# Patient Record
Sex: Female | Born: 1960 | Race: White | Hispanic: No | Smoking: Former smoker
Health system: Southern US, Community
[De-identification: ages and names within clinical notes are randomized; demographics above are authoritative.]

## PROBLEM LIST (undated history)

## (undated) DIAGNOSIS — E785 Hyperlipidemia, unspecified: Secondary | ICD-10-CM

## (undated) DIAGNOSIS — G4730 Sleep apnea, unspecified: Secondary | ICD-10-CM

## (undated) DIAGNOSIS — I10 Essential (primary) hypertension: Secondary | ICD-10-CM

## (undated) HISTORY — DX: Essential (primary) hypertension: I10

## (undated) HISTORY — PX: EYE SURGERY: SHX253

## (undated) HISTORY — DX: Hyperlipidemia, unspecified: E78.5

## (undated) HISTORY — DX: Sleep apnea, unspecified: G47.30

---

## 2000-06-11 ENCOUNTER — Other Ambulatory Visit: Admission: RE | Admit: 2000-06-11 | Discharge: 2000-06-11 | Payer: Self-pay | Admitting: Internal Medicine

## 2001-09-01 ENCOUNTER — Encounter: Payer: Self-pay | Admitting: Occupational Medicine

## 2001-09-01 ENCOUNTER — Encounter: Admission: RE | Admit: 2001-09-01 | Discharge: 2001-09-01 | Payer: Self-pay | Admitting: Occupational Medicine

## 2002-09-01 ENCOUNTER — Encounter: Payer: Self-pay | Admitting: Family Medicine

## 2002-09-01 ENCOUNTER — Encounter: Admission: RE | Admit: 2002-09-01 | Discharge: 2002-09-01 | Payer: Self-pay | Admitting: Family Medicine

## 2004-01-02 ENCOUNTER — Encounter: Admission: RE | Admit: 2004-01-02 | Discharge: 2004-01-02 | Payer: Self-pay | Admitting: Occupational Medicine

## 2004-01-08 ENCOUNTER — Encounter: Admission: RE | Admit: 2004-01-08 | Discharge: 2004-01-08 | Payer: Self-pay | Admitting: Occupational Medicine

## 2005-03-21 ENCOUNTER — Encounter: Admission: RE | Admit: 2005-03-21 | Discharge: 2005-03-21 | Payer: Self-pay | Admitting: Neurosurgery

## 2006-05-14 ENCOUNTER — Ambulatory Visit: Payer: Self-pay | Admitting: Family Medicine

## 2007-10-15 ENCOUNTER — Ambulatory Visit: Payer: Self-pay | Admitting: Internal Medicine

## 2007-11-18 ENCOUNTER — Ambulatory Visit: Payer: Self-pay | Admitting: Internal Medicine

## 2007-11-26 ENCOUNTER — Ambulatory Visit: Payer: Self-pay | Admitting: Internal Medicine

## 2009-08-29 ENCOUNTER — Ambulatory Visit: Payer: Self-pay | Admitting: Family Medicine

## 2010-11-30 ENCOUNTER — Encounter: Payer: Self-pay | Admitting: Occupational Medicine

## 2010-12-19 ENCOUNTER — Ambulatory Visit: Payer: Self-pay | Admitting: Internal Medicine

## 2011-03-25 ENCOUNTER — Ambulatory Visit: Payer: Self-pay | Admitting: Gastroenterology

## 2011-03-27 LAB — PATHOLOGY REPORT

## 2011-12-12 ENCOUNTER — Ambulatory Visit: Payer: Self-pay

## 2011-12-22 ENCOUNTER — Ambulatory Visit: Payer: Self-pay

## 2013-02-09 ENCOUNTER — Ambulatory Visit: Payer: Self-pay

## 2013-08-30 IMAGING — MG MAM DGTL SCREENING MAMMO W/CAD
1 series · 4 of 4 positions shown · non-contrast
Comparison: none

REASON FOR EXAM: SCR MAMMO
COMMENTS:

[R CC · right · 4 of 4 slices shown]
[im 1/4]
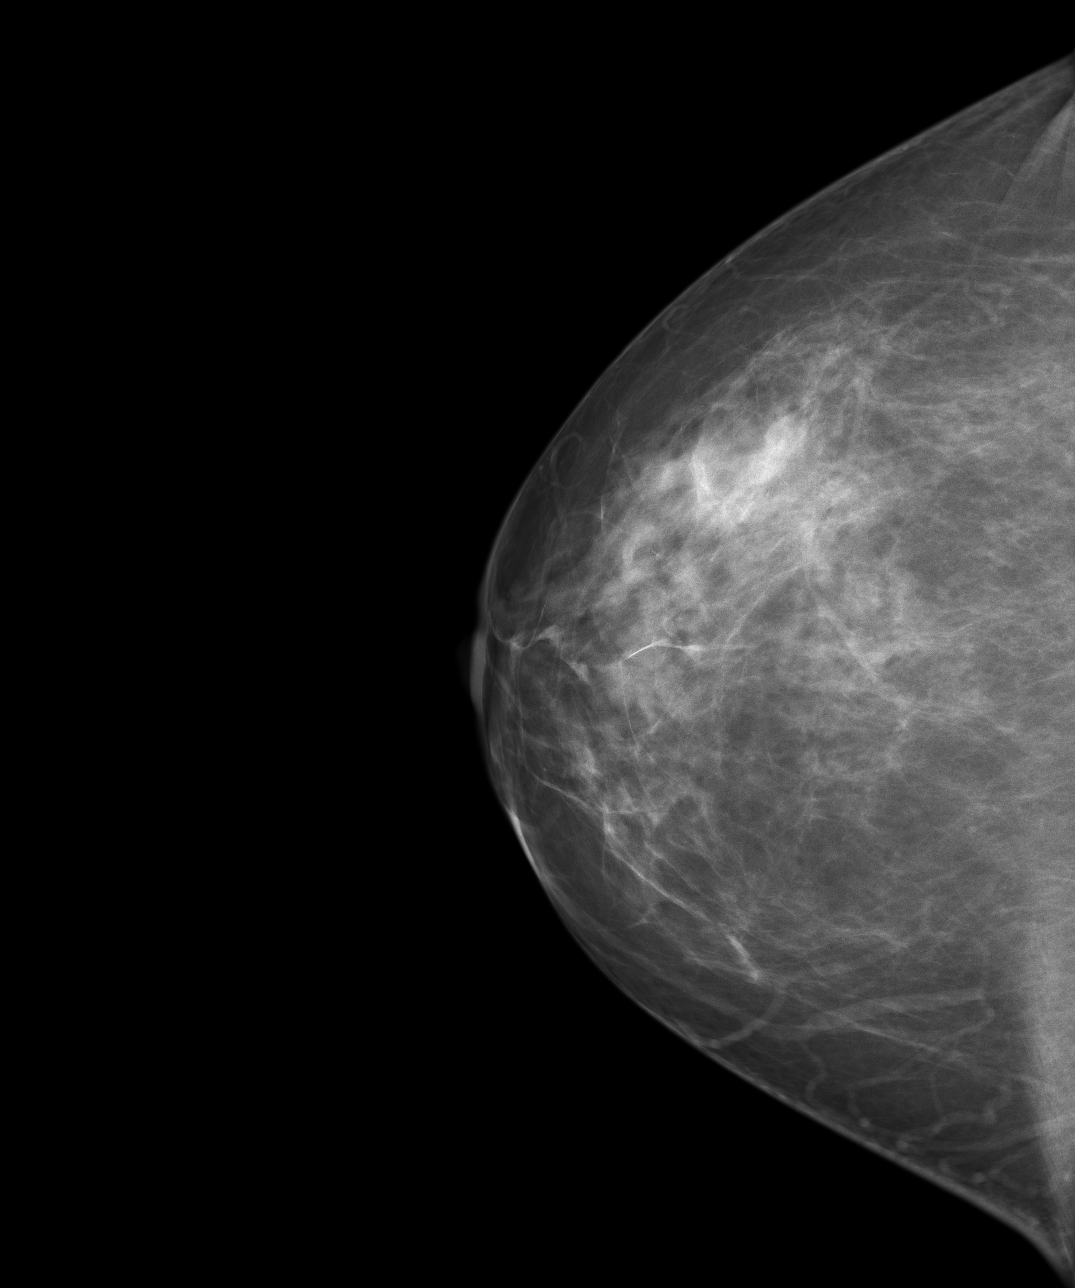
[im 2/4]
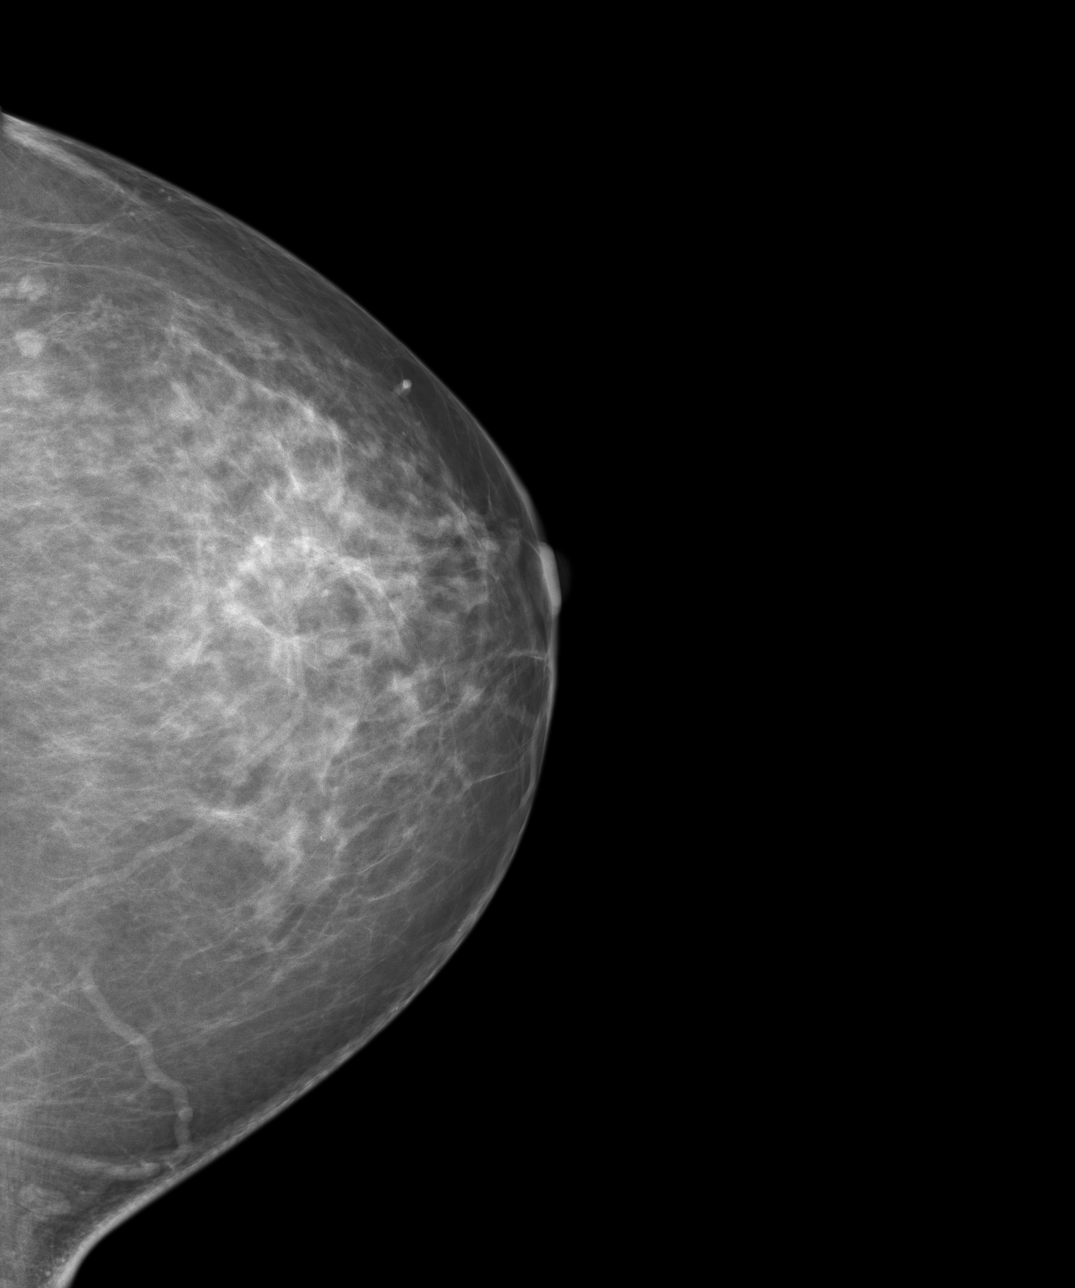
[im 3/4]
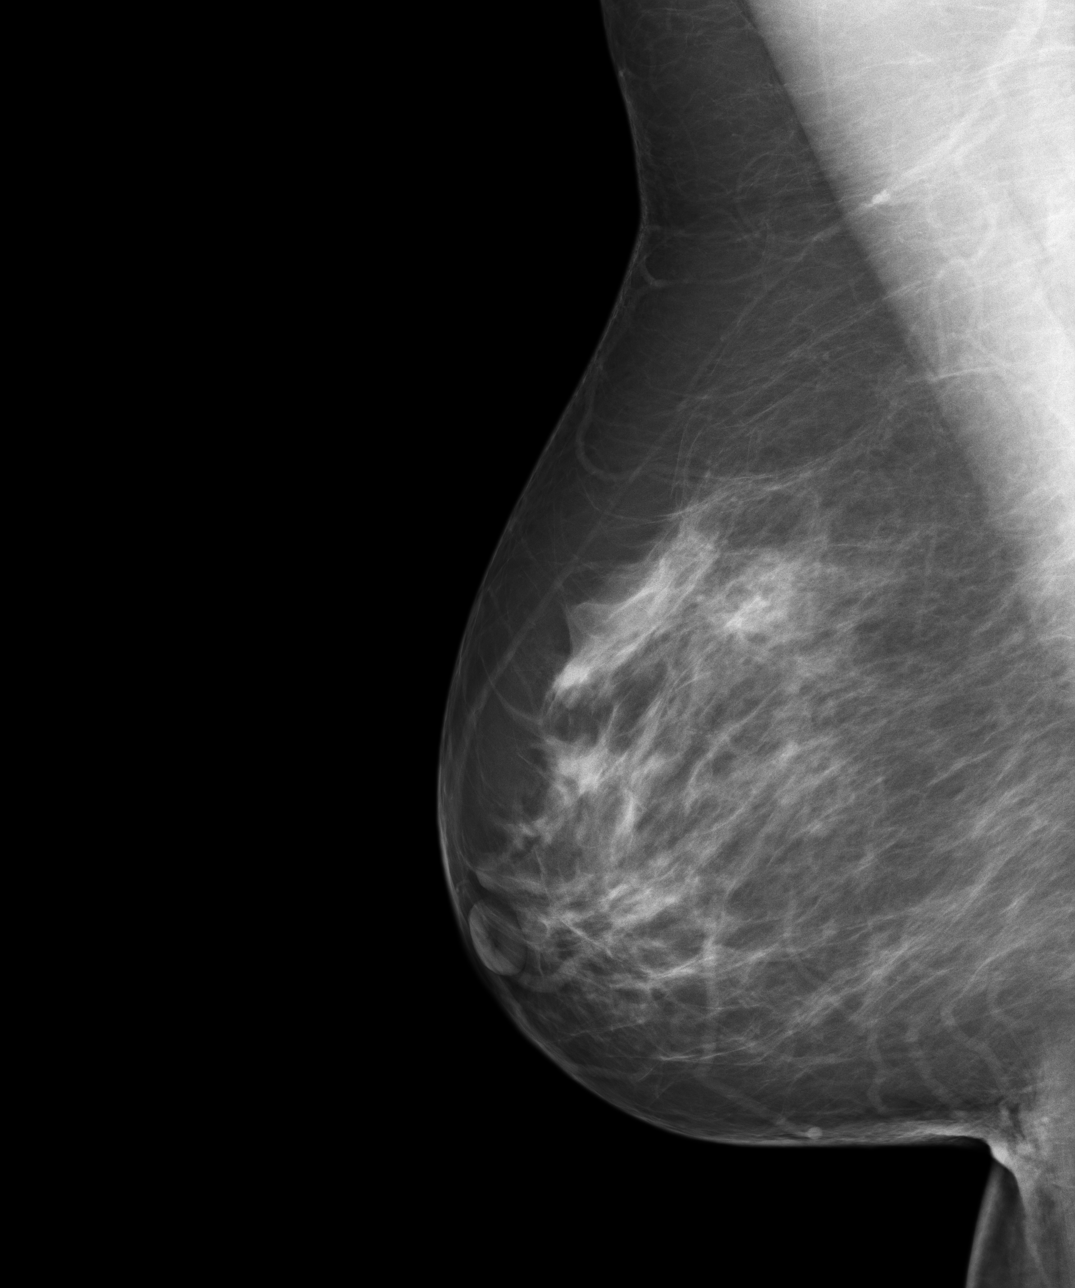
[im 4/4]
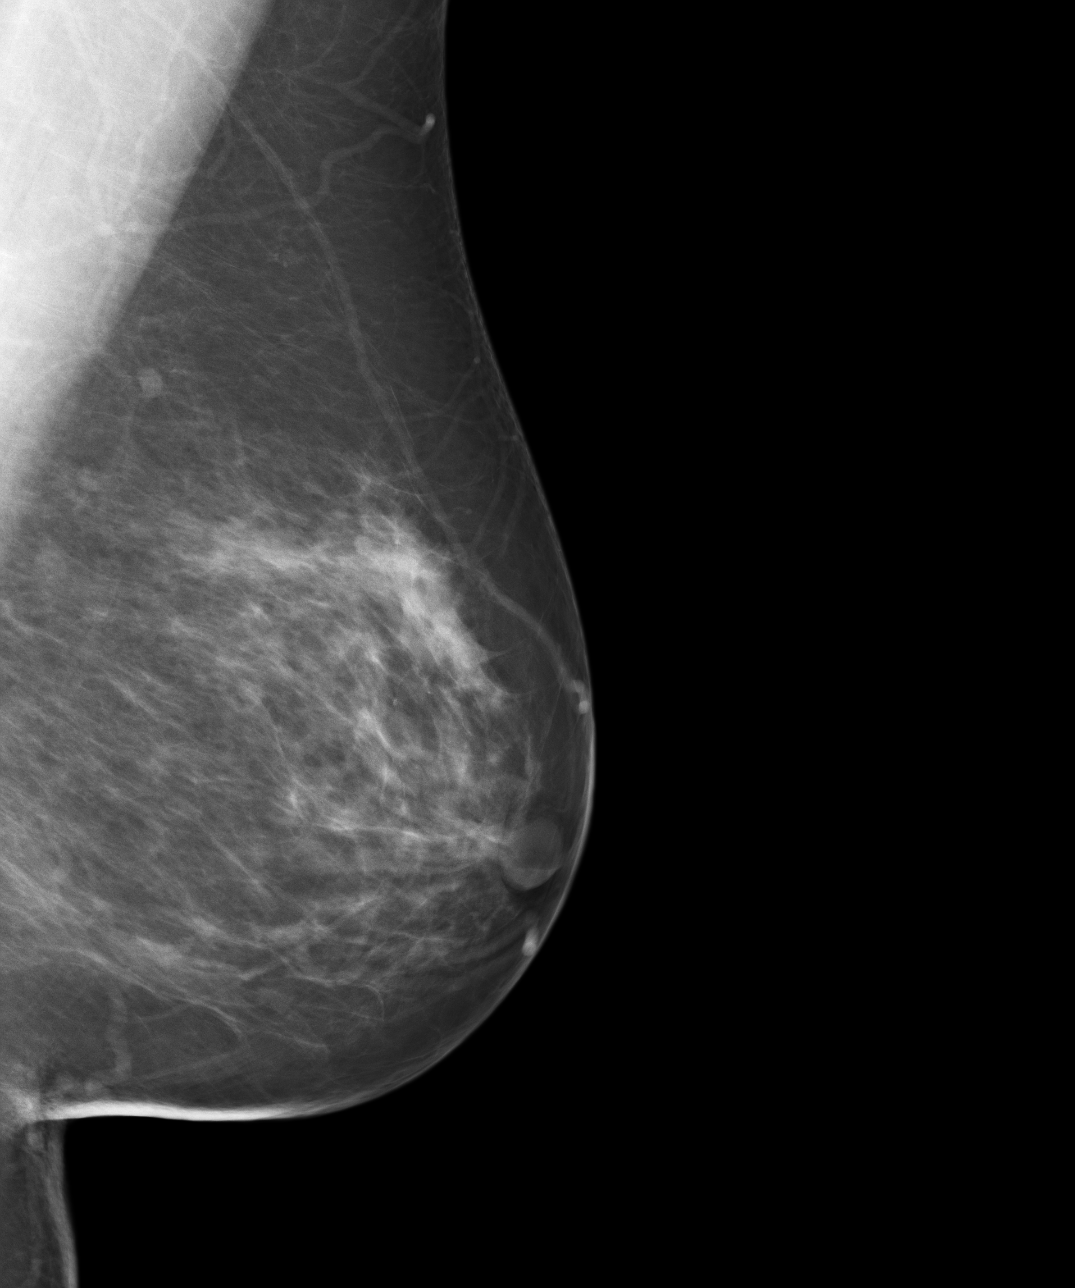

[4 of 4 positions shown; findings below may reference images not displayed]

PROCEDURE:     MAM - MAM DGTL SCREENING MAMMO W/CAD  - December 22, 2011 [DATE]

RESULT:      Comparison is made to a previous digital study of 12/19/2010 and
11/18/2007.

The breasts exhibit a heterogeously dense parenchymal pattern. Stable
nodularity is noted superolaterally on the left. There is no dominant mass
and there are no malignant-appearing groupings of microcalcification. No
area of new architectural distortion is seen.
IMPRESSION: 1.      I do not see findings suspicious for malignancy.
2.      BI-RADS 2:  Benign Findings.

RECOMMENTATION:   Please continue to encourage yearly mammographic follow
up.

A NEGATIVE MAMMOGRAM REPORT DOES NOT PRECLUDE BIOPSY OR OTHER EVALUATION OF
A CLINICALLY PALPABLE OR OTHERWISE SUSPICOUS MASS OR LESION.  BREAST CANCER
MAY NOT BE DETECTED BY MAMMOGRAPHY IN UP TO 10% OF CASES.

## 2014-03-30 ENCOUNTER — Ambulatory Visit: Payer: Self-pay

## 2014-04-05 ENCOUNTER — Ambulatory Visit: Payer: Self-pay

## 2014-10-19 IMAGING — MG MAM DGTL SCRN MAM NO ORDER W/CAD
1 series · 5 of 5 positions shown · non-contrast
Comparison: none

REASON FOR EXAM: SCR MAMMO NO ORDER
COMMENTS:

PROCEDURE:     MAM - MAM DGTL SCRN MAM NO ORDER W/CAD  - February 09, 2013  [DATE]
RESULT:     Bilateral digital screening mammogram with CAD dated 02/09/2013
Comparison study/studies dated: 12/22/2011, 12/19/2010, 11/18/2007

[R CC · right · 5 of 5 slices shown]
[im 1/5]
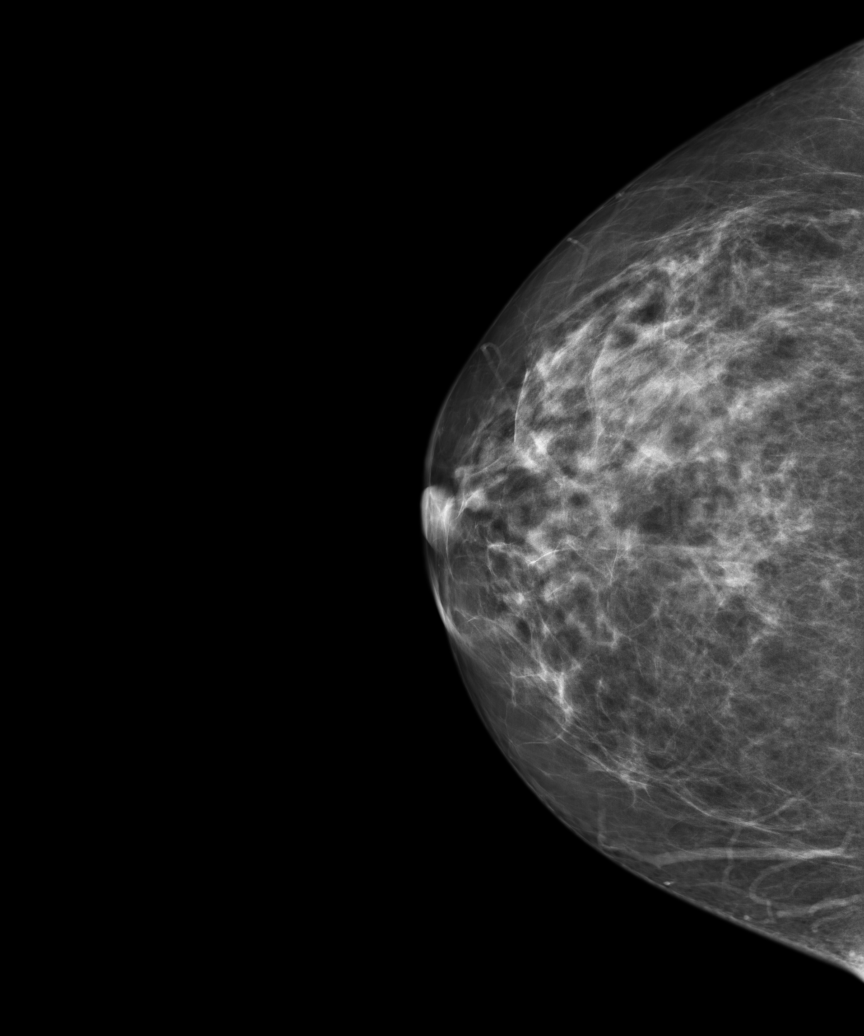
[im 2/5]
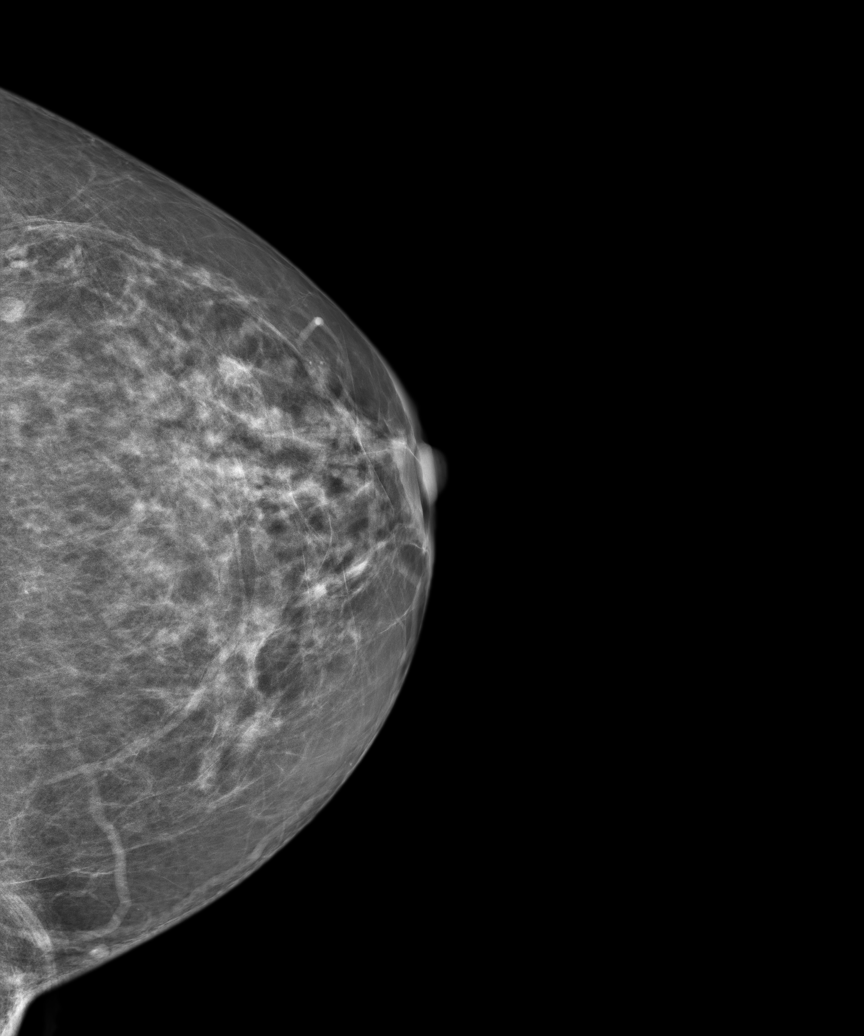
[im 3/5]
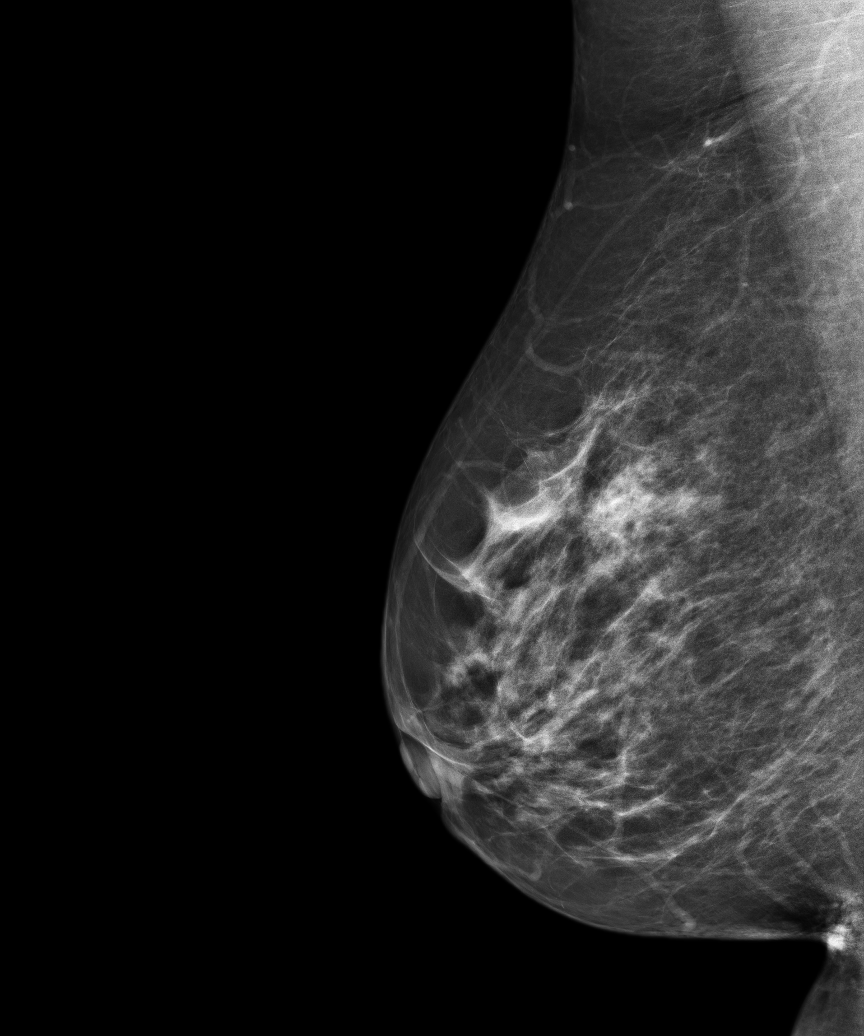
[im 4/5]
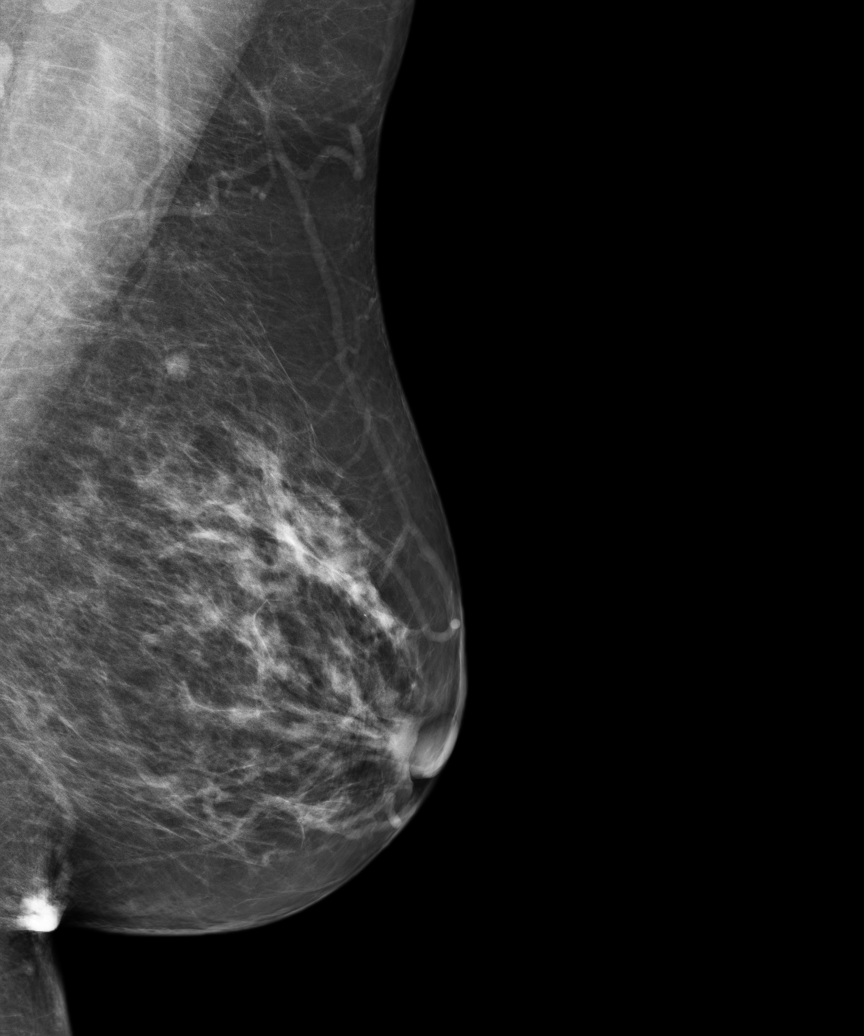
[im 5/5]
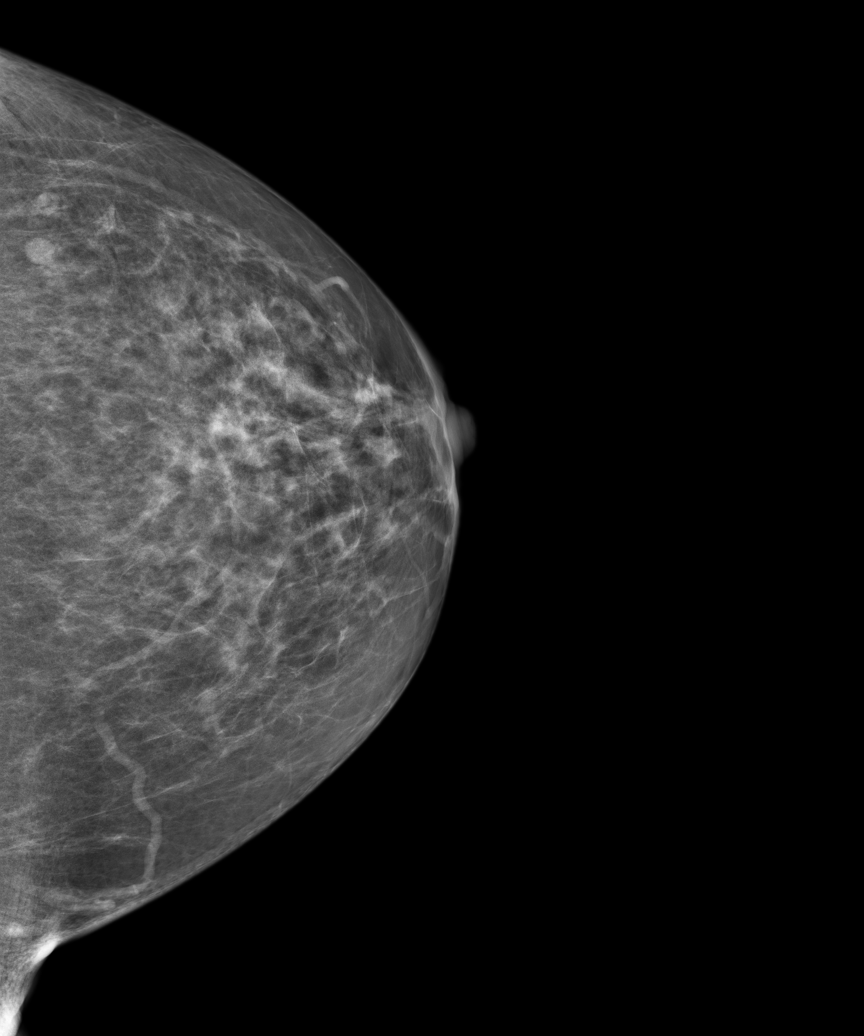

[5 of 5 positions shown; findings below may reference images not displayed]

FINDINGS: BREAST COMPOSITION: The breast composition is HETEROGENEOUSLY
DENSE (glandular tissue is 51-75%) This may decrease the sensitivity of
mammography.

There is no new radiographic evidence to suggest malignancy.
IMPRESSION: BI-RADS: Category 2- Benign Finding

A NEGATIVE MAMMOGRAM REPORT DOES NOT PRECLUDE BIOPSY OR OTHER EVALUATION OF
A CLINICALLY PALPABLE OR OTHERWISE SUSPICIOUS MASS OR LESION. BREAST CANCER
MAY NOT BE DETECTED IN UP TO 10% OF CASES.

## 2015-12-13 IMAGING — MG MM ADDITIONAL VIEWS AT NO CHARGE
1 series · 2 of 2 positions shown · non-contrast
Comparison: 03/30/2014 and prior mammograms

CLINICAL DATA: 53-year-old female with possible left breast mass on
screening mammogram.

EXAM:
DIGITAL DIAGNOSTIC  LEFT MAMMOGRAM

[L CC · left · 2 of 2 slices shown]
[im 1/2]
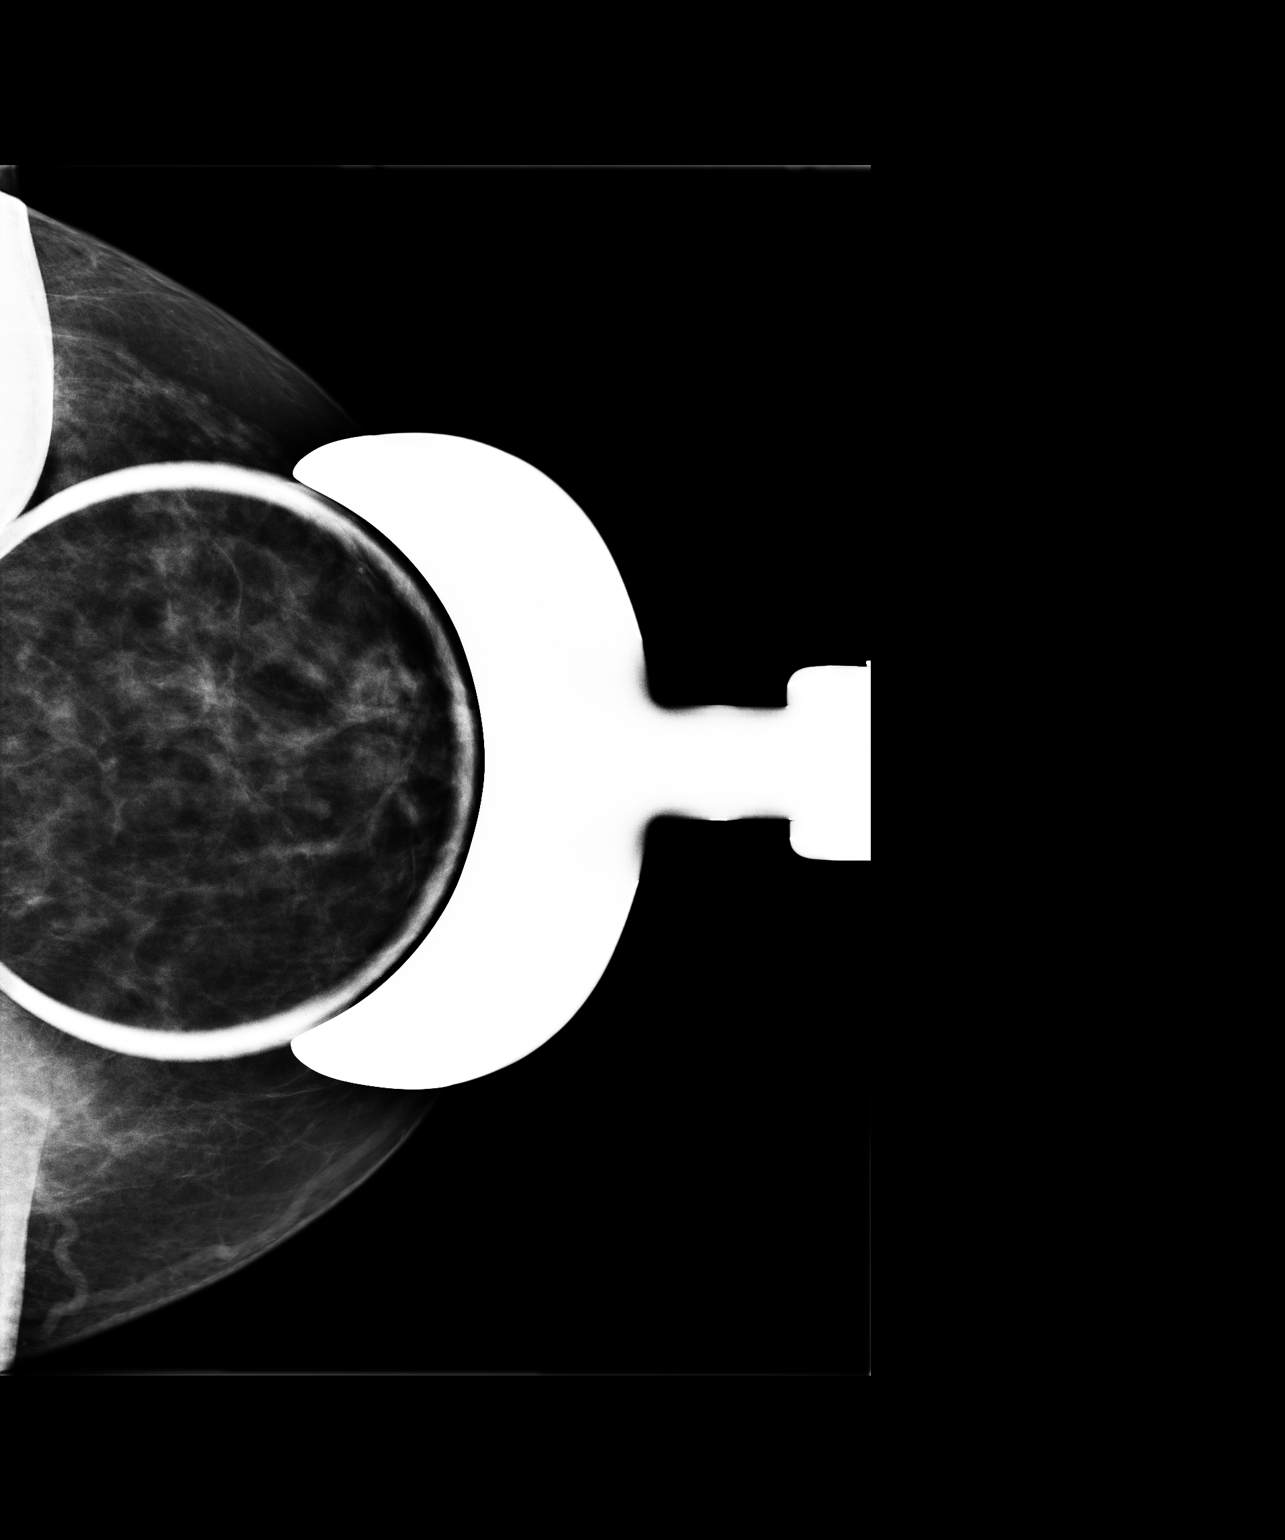
[im 2/2]
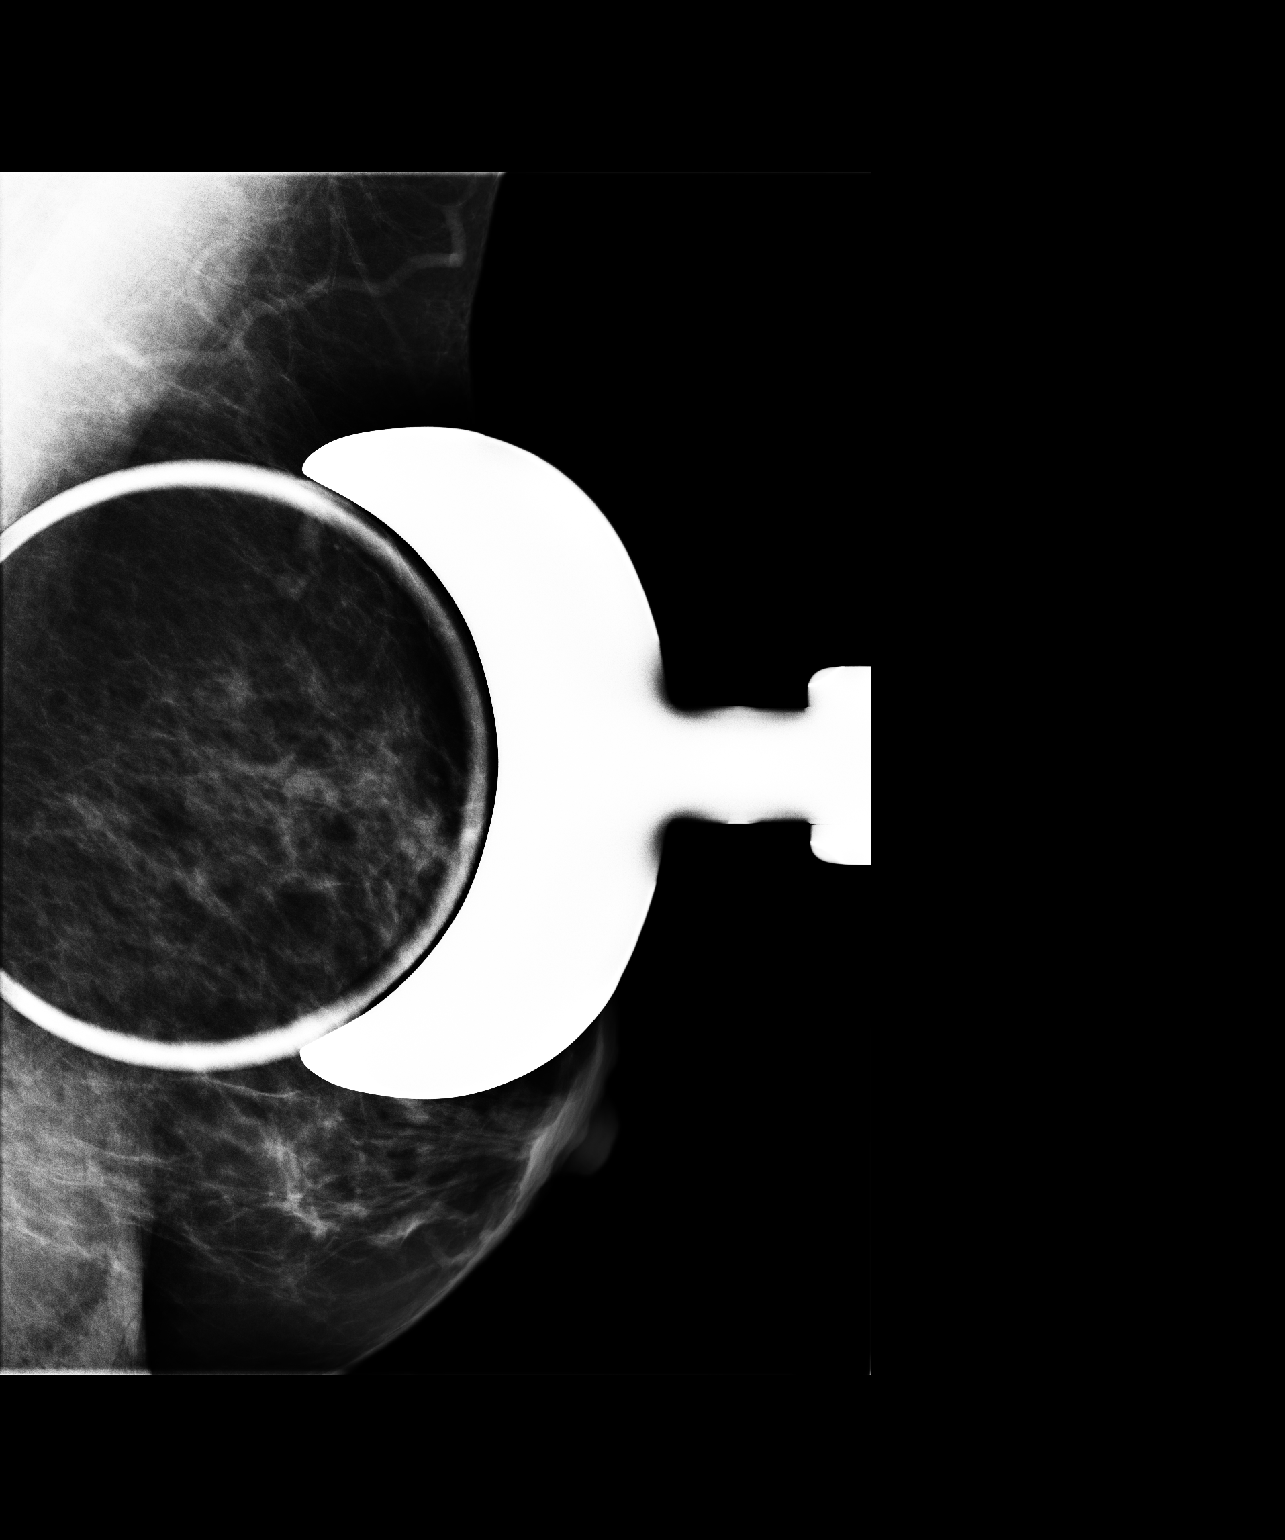

[2 of 2 positions shown; findings below may reference images not displayed]

ACR Breast Density Category b: There are scattered areas of
fibroglandular density.
FINDINGS: CC and MLO spot compression views the left breast demonstrate no
persistent mass, distortion or worrisome calcifications in the area
of the screening study finding.
IMPRESSION: No persistent abnormality in the area of the screening study
finding.

RECOMMENDATION:
Bilateral screening mammograms in 1 year.

I have discussed the findings and recommendations with the patient.
Results were also provided in writing at the conclusion of the
visit. If applicable, a reminder letter will be sent to the patient
regarding the next appointment.

BI-RADS CATEGORY  1: Negative.

## 2016-05-19 DIAGNOSIS — F331 Major depressive disorder, recurrent, moderate: Secondary | ICD-10-CM | POA: Diagnosis not present

## 2016-05-19 DIAGNOSIS — G4733 Obstructive sleep apnea (adult) (pediatric): Secondary | ICD-10-CM | POA: Diagnosis not present

## 2016-05-19 DIAGNOSIS — J449 Chronic obstructive pulmonary disease, unspecified: Secondary | ICD-10-CM | POA: Diagnosis not present

## 2016-05-19 DIAGNOSIS — R0602 Shortness of breath: Secondary | ICD-10-CM | POA: Diagnosis not present

## 2016-07-07 DIAGNOSIS — R011 Cardiac murmur, unspecified: Secondary | ICD-10-CM | POA: Diagnosis not present

## 2016-07-07 DIAGNOSIS — F17211 Nicotine dependence, cigarettes, in remission: Secondary | ICD-10-CM | POA: Diagnosis not present

## 2016-07-07 DIAGNOSIS — I10 Essential (primary) hypertension: Secondary | ICD-10-CM | POA: Diagnosis not present

## 2016-07-07 DIAGNOSIS — Z0001 Encounter for general adult medical examination with abnormal findings: Secondary | ICD-10-CM | POA: Diagnosis not present

## 2016-07-07 DIAGNOSIS — R609 Edema, unspecified: Secondary | ICD-10-CM | POA: Diagnosis not present

## 2016-07-23 DIAGNOSIS — R0602 Shortness of breath: Secondary | ICD-10-CM | POA: Diagnosis not present

## 2016-08-29 ENCOUNTER — Other Ambulatory Visit
Admission: RE | Admit: 2016-08-29 | Discharge: 2016-08-29 | Disposition: A | Discharge status: Home / Self Care | Payer: Federal, State, Local not specified - PPO | Source: Ambulatory Visit | Attending: Nurse Practitioner | Admitting: Nurse Practitioner

## 2016-08-29 DIAGNOSIS — R601 Generalized edema: Secondary | ICD-10-CM | POA: Insufficient documentation

## 2016-08-29 DIAGNOSIS — E559 Vitamin D deficiency, unspecified: Secondary | ICD-10-CM | POA: Insufficient documentation

## 2016-08-29 DIAGNOSIS — E785 Hyperlipidemia, unspecified: Secondary | ICD-10-CM | POA: Diagnosis not present

## 2016-08-29 DIAGNOSIS — Z0000 Encounter for general adult medical examination without abnormal findings: Secondary | ICD-10-CM | POA: Insufficient documentation

## 2016-08-29 DIAGNOSIS — I10 Essential (primary) hypertension: Secondary | ICD-10-CM | POA: Diagnosis not present

## 2016-08-29 LAB — CBC
HCT: 42.6 % (ref 35.0–47.0)
Hemoglobin: 14.6 g/dL (ref 12.0–16.0)
MCH: 30.8 pg (ref 26.0–34.0)
MCHC: 34.3 g/dL (ref 32.0–36.0)
MCV: 89.8 fL (ref 80.0–100.0)
PLATELETS: 143 10*3/uL — AB (ref 150–440)
RBC: 4.74 MIL/uL (ref 3.80–5.20)
RDW: 13.2 % (ref 11.5–14.5)
WBC: 6.7 10*3/uL (ref 3.6–11.0)

## 2016-08-29 LAB — LIPID PANEL
CHOL/HDL RATIO: 2.4 ratio
CHOLESTEROL: 116 mg/dL (ref 0–200)
HDL: 49 mg/dL (ref >40)
LDL Cholesterol: 45 mg/dL (ref 0–99)
Triglycerides: 111 mg/dL (ref <150)
VLDL: 22 mg/dL (ref 0–40)

## 2016-08-29 LAB — COMPREHENSIVE METABOLIC PANEL
ALBUMIN: 4.1 g/dL (ref 3.5–5.0)
ALT: 19 U/L (ref 14–54)
ANION GAP: 9 (ref 5–15)
AST: 25 U/L (ref 15–41)
Alkaline Phosphatase: 48 U/L (ref 38–126)
BUN: 19 mg/dL (ref 6–20)
CHLORIDE: 101 mmol/L (ref 101–111)
CO2: 27 mmol/L (ref 22–32)
Calcium: 9.5 mg/dL (ref 8.9–10.3)
Creatinine, Ser: 0.97 mg/dL (ref 0.44–1.00)
GFR calc Af Amer: >60 mL/min (ref >60)
Glucose, Bld: 112 mg/dL — ABNORMAL HIGH (ref 65–99)
POTASSIUM: 3.9 mmol/L (ref 3.5–5.1)
Sodium: 137 mmol/L (ref 135–145)
Total Bilirubin: 0.7 mg/dL (ref 0.3–1.2)
Total Protein: 7.2 g/dL (ref 6.5–8.1)

## 2016-08-29 LAB — T4, FREE: FREE T4: 0.96 ng/dL (ref 0.61–1.12)

## 2016-08-29 LAB — TSH: TSH: 2.611 u[IU]/mL (ref 0.350–4.500)

## 2016-08-30 LAB — VITAMIN D 25 HYDROXY (VIT D DEFICIENCY, FRACTURES): Vit D, 25-Hydroxy: 26.7 ng/mL — ABNORMAL LOW (ref 30.0–100.0)

## 2016-09-03 DIAGNOSIS — F17211 Nicotine dependence, cigarettes, in remission: Secondary | ICD-10-CM | POA: Diagnosis not present

## 2016-09-03 DIAGNOSIS — G4733 Obstructive sleep apnea (adult) (pediatric): Secondary | ICD-10-CM | POA: Diagnosis not present

## 2016-09-03 DIAGNOSIS — R011 Cardiac murmur, unspecified: Secondary | ICD-10-CM | POA: Diagnosis not present

## 2016-09-03 DIAGNOSIS — I10 Essential (primary) hypertension: Secondary | ICD-10-CM | POA: Diagnosis not present

## 2016-09-15 DIAGNOSIS — J449 Chronic obstructive pulmonary disease, unspecified: Secondary | ICD-10-CM | POA: Diagnosis not present

## 2016-09-15 DIAGNOSIS — G4733 Obstructive sleep apnea (adult) (pediatric): Secondary | ICD-10-CM | POA: Diagnosis not present

## 2016-09-15 DIAGNOSIS — F17211 Nicotine dependence, cigarettes, in remission: Secondary | ICD-10-CM | POA: Diagnosis not present

## 2017-04-27 DIAGNOSIS — E782 Mixed hyperlipidemia: Secondary | ICD-10-CM | POA: Diagnosis not present

## 2017-04-27 DIAGNOSIS — G4733 Obstructive sleep apnea (adult) (pediatric): Secondary | ICD-10-CM | POA: Diagnosis not present

## 2017-04-27 DIAGNOSIS — J449 Chronic obstructive pulmonary disease, unspecified: Secondary | ICD-10-CM | POA: Diagnosis not present

## 2017-04-27 DIAGNOSIS — I10 Essential (primary) hypertension: Secondary | ICD-10-CM | POA: Diagnosis not present

## 2017-07-20 DIAGNOSIS — R609 Edema, unspecified: Secondary | ICD-10-CM | POA: Diagnosis not present

## 2017-07-20 DIAGNOSIS — J449 Chronic obstructive pulmonary disease, unspecified: Secondary | ICD-10-CM | POA: Diagnosis not present

## 2017-07-20 DIAGNOSIS — E6609 Other obesity due to excess calories: Secondary | ICD-10-CM | POA: Diagnosis not present

## 2017-07-20 DIAGNOSIS — G4733 Obstructive sleep apnea (adult) (pediatric): Secondary | ICD-10-CM | POA: Diagnosis not present

## 2017-10-26 ENCOUNTER — Other Ambulatory Visit: Payer: Self-pay | Admitting: Nurse Practitioner

## 2017-10-27 ENCOUNTER — Other Ambulatory Visit: Payer: Self-pay | Admitting: Internal Medicine

## 2017-11-02 ENCOUNTER — Other Ambulatory Visit: Payer: Self-pay | Admitting: Internal Medicine

## 2018-01-12 ENCOUNTER — Other Ambulatory Visit: Payer: Self-pay

## 2018-01-12 MED ORDER — FUROSEMIDE 20 MG PO TABS: 20.0000 mg | ORAL_TABLET | Freq: Every day | ORAL | 5 refills | Status: DC | PRN

## 2018-01-12 MED ORDER — METOPROLOL SUCCINATE 100 MG PO CS24: 100.0000 mg | EXTENDED_RELEASE_CAPSULE | Freq: Every day | ORAL | 5 refills | Status: DC

## 2018-01-12 MED ORDER — LOSARTAN POTASSIUM-HCTZ 100-25 MG PO TABS: 1.0000 | ORAL_TABLET | Freq: Every day | ORAL | 5 refills | Status: DC

## 2018-03-18 ENCOUNTER — Ambulatory Visit: Payer: Federal, State, Local not specified - PPO | Admitting: Internal Medicine

## 2018-03-18 ENCOUNTER — Encounter: Payer: Self-pay | Admitting: Internal Medicine

## 2018-03-18 VITALS — BP 148/80 | HR 64 | Resp 16 | Ht 69.0 in | Wt 231.0 lb

## 2018-03-18 DIAGNOSIS — Z9989 Dependence on other enabling machines and devices: Secondary | ICD-10-CM | POA: Diagnosis not present

## 2018-03-18 DIAGNOSIS — I10 Essential (primary) hypertension: Secondary | ICD-10-CM | POA: Insufficient documentation

## 2018-03-18 DIAGNOSIS — J449 Chronic obstructive pulmonary disease, unspecified: Secondary | ICD-10-CM | POA: Diagnosis not present

## 2018-03-18 DIAGNOSIS — G4733 Obstructive sleep apnea (adult) (pediatric): Secondary | ICD-10-CM | POA: Diagnosis not present

## 2018-03-18 DIAGNOSIS — E785 Hyperlipidemia, unspecified: Secondary | ICD-10-CM | POA: Insufficient documentation

## 2018-03-18 DIAGNOSIS — G4730 Sleep apnea, unspecified: Secondary | ICD-10-CM | POA: Insufficient documentation

## 2018-03-18 NOTE — Progress Notes (Signed)
[facility] [place], [place] [address]  Pulmonary Sleep Medicine   Office Visit Note  Patient Name: Breanna Flores DOB: Nov 15, 1960 MRN [id number]  Date of Service: 03/18/2018  Complaints/HPI:   She is doing well.  She has had no admissions to the hospital.  Continues to use her CPAP device as prescribed.  She is working on trying to lose weight.  She is not smoking.  Denies having any chest pain no cough no congestion.  No nausea no vomiting.  ROS  General: (-) fever, (-) chills, (-) night sweats, (-) weakness Skin: (-) rashes, (-) itching,. Eyes: (-) visual changes, (-) redness, (-) itching. Nose and Sinuses: (-) nasal stuffiness or itchiness, (-) postnasal drip, (-) nosebleeds, (-) sinus trouble. Mouth and Throat: (-) sore throat, (-) hoarseness. Neck: (-) swollen glands, (-) enlarged thyroid, (-) neck pain. Respiratory: - cough, (-) bloody sputum, - shortness of breath, - wheezing. Cardiovascular: - ankle swelling, (-) chest pain. Lymphatic: (-) lymph node enlargement. Neurologic: (-) numbness, (-) tingling. Psychiatric: (-) anxiety, (-) depression   Current Medication: Outpatient Encounter Medications as of 03/18/2018  Medication Sig  . amLODipine (NORVASC) 10 MG tablet TAKE 1 TABLET BY MOUTH ONCE DAILY  . CHANTIX CONTINUING MONTH PAK 1 MG tablet TAKE AS DIRECTED  . furosemide (LASIX) 20 MG tablet Take 1 tablet (20 mg total) by mouth daily as needed.  Marland Kitchen losartan-hydrochlorothiazide (HYZAAR) 100-25 MG tablet Take 1 tablet by mouth daily.  . Metoprolol Succinate 100 MG CS24 Take 100 mg by mouth daily.  . simvastatin (ZOCOR) 20 MG tablet TAKE 1 TABLET BY MOUTH ONCE DAILY   No facility-administered encounter medications on file as of 03/18/2018.     Surgical History: Past Surgical History:  Procedure Laterality Date  . [place] History: Past Medical History:  Diagnosis Date  . Hyperlipidemia   . Hypertension   . Sleep  apnea     Family History: Family History  Problem Relation Age of Onset  . Dementia Mother   . Heart murmur Mother   . Hypertension Father   . Gout Father   . Hyperlipidemia Father     Social History: Social History   Socioeconomic History  . Marital status: Married    Spouse name: Not on file  . Number of children: Not on file  . Years of education: Not on file  . Highest education level: Not on file  Occupational History  . Not on file  Social Needs  . Financial resource strain: Not on file  . Food insecurity:    Worry: Not on file    Inability: Not on file  . Transportation needs:    Medical: Not on file    Non-medical: Not on file  Tobacco Use  . Smoking status: Former Smoker    Last attempt to quit: 2016    Years since quitting: 3.3  . Smokeless tobacco: Never Used  Substance and Sexual Activity  . Alcohol use: Yes    Comment: occationally  . Drug use: Never  . Sexual activity: Not on file  Lifestyle  . Physical activity:    Days per week: Not on file    Minutes per session: Not on file  . Stress: Not on file  Relationships  . Social connections:    Talks on phone: Not on file    Gets together: Not on file    Attends religious service: Not on file    Active  member of club or organization: Not on file    Attends meetings of clubs or organizations: Not on file    Relationship status: Not on file  . Intimate partner violence:    Fear of current or ex partner: Not on file    Emotionally abused: Not on file    Physically abused: Not on file    Forced sexual activity: Not on file  Other Topics Concern  . Not on file  Social History Narrative  . Not on file    Vital Signs: Blood pressure (!) 148/80, pulse 64, resp. rate 16, height 5\' 9"  (1.753 m), weight 231 lb (104.8 kg), SpO2 99 %.  Examination: General Appearance: The patient is well-developed, well-nourished, and in no distress. Skin: Gross inspection of skin unremarkable. Head: normocephalic,  no gross deformities. Eyes: no gross deformities noted. ENT: ears appear grossly normal no exudates. Neck: Supple. No thyromegaly. No LAD. Respiratory: no rhonchi. Cardiovascular: Normal S1 and S2 without murmur or rub. Extremities: No cyanosis. pulses are equal. Neurologic: Alert and oriented. No involuntary movements.  LABS: No results found for this or any previous visit (from the past 2160 hour(s)).  Radiology: No results found.  No results found.  No results found.    Assessment and Plan: Patient Active Problem List   Diagnosis Date Noted  . Sleep apnea 03/18/2018  . Essential hypertension 03/18/2018  . Hyperlipidemia 03/18/2018    1. OSA  Continue with CPAP on the present settings continue with supportive care 2. COPD stable at this time since she quit smoking continue to monitor 3. Morbid obesity working on weight loss with dietary restriction  General Counseling: I have discussed the findings of the evaluation and examination with [name].  I have also discussed any further diagnostic evaluation thatmay be needed or ordered today. [name] verbalizes understanding of the findings of todays visit. We also reviewed her medications today and discussed drug interactions and side effects including but not limited excessive drowsiness and altered mental states. We also discussed that there is always a risk not just to her but also people around her. she has been encouraged to call the office with any questions or concerns that should arise related to todays visit.    Time spent: 52min  I have personally obtained a history, examined the patient, evaluated laboratory and imaging results, formulated the assessment and plan and placed orders.    Allyne Gee, MD [facility] Pulmonary and Critical Care Sleep medicine

## 2018-03-18 NOTE — Patient Instructions (Signed)

## 2018-03-23 ENCOUNTER — Other Ambulatory Visit: Payer: Self-pay | Admitting: Internal Medicine

## 2018-05-26 ENCOUNTER — Other Ambulatory Visit: Payer: Self-pay | Admitting: Internal Medicine

## 2018-05-27 ENCOUNTER — Other Ambulatory Visit: Payer: Self-pay

## 2018-05-27 MED ORDER — SIMVASTATIN 20 MG PO TABS: 20.0000 mg | ORAL_TABLET | Freq: Every day | ORAL | 5 refills | Status: DC

## 2018-06-28 ENCOUNTER — Other Ambulatory Visit: Payer: Self-pay

## 2018-06-28 MED ORDER — METOPROLOL SUCCINATE 100 MG PO CS24: 100.0000 mg | EXTENDED_RELEASE_CAPSULE | Freq: Every day | ORAL | 5 refills | Status: DC

## 2018-07-06 ENCOUNTER — Other Ambulatory Visit: Payer: Self-pay

## 2018-07-06 MED ORDER — AMLODIPINE BESYLATE 10 MG PO TABS: 10.0000 mg | ORAL_TABLET | Freq: Every day | ORAL | 6 refills | Status: DC

## 2018-08-15 ENCOUNTER — Other Ambulatory Visit: Payer: Self-pay | Admitting: Internal Medicine

## 2018-08-16 ENCOUNTER — Other Ambulatory Visit: Payer: Self-pay

## 2018-08-16 MED ORDER — FUROSEMIDE 20 MG PO TABS: 20.0000 mg | ORAL_TABLET | Freq: Every day | ORAL | 5 refills | Status: DC | PRN

## 2018-09-23 ENCOUNTER — Ambulatory Visit: Payer: Self-pay | Admitting: Internal Medicine

## 2018-10-25 ENCOUNTER — Other Ambulatory Visit: Payer: Self-pay

## 2018-10-25 MED ORDER — LOSARTAN POTASSIUM-HCTZ 100-25 MG PO TABS: 1.0000 | ORAL_TABLET | Freq: Every day | ORAL | 5 refills | Status: DC

## 2018-11-18 ENCOUNTER — Ambulatory Visit: Payer: Federal, State, Local not specified - PPO | Admitting: Internal Medicine

## 2018-11-18 ENCOUNTER — Encounter: Payer: Self-pay | Admitting: Internal Medicine

## 2018-11-18 VITALS — BP 130/80 | HR 67 | Resp 16 | Ht 69.0 in | Wt 230.0 lb

## 2018-11-18 DIAGNOSIS — G4730 Sleep apnea, unspecified: Secondary | ICD-10-CM

## 2018-11-18 DIAGNOSIS — J449 Chronic obstructive pulmonary disease, unspecified: Secondary | ICD-10-CM

## 2018-11-18 NOTE — Progress Notes (Signed)
[facility] [place], [place] [address]  Pulmonary Sleep Medicine   Office Visit Note  Patient Name: Breanna Flores DOB: [birth year] MRN [id number]  Date of Service: 11/18/2018  Complaints/HPI: Doing well at this time no cough noted.She states that she is feling down lost a friend recently. Not smoking. Continues to use the CPAP device. Has not been able lose weight  ROS  General: (-) fever, (-) chills, (-) night sweats, (-) weakness Skin: (-) rashes, (-) itching,. Eyes: (-) visual changes, (-) redness, (-) itching. Nose and Sinuses: (-) nasal stuffiness or itchiness, (-) postnasal drip, (-) nosebleeds, (-) sinus trouble. Mouth and Throat: (-) sore throat, (-) hoarseness. Neck: (-) swollen glands, (-) enlarged thyroid, (-) neck pain. Respiratory: + cough, (-) bloody sputum, + shortness of breath, - wheezing. Cardiovascular: - ankle swelling, (-) chest pain. Lymphatic: (-) lymph node enlargement. Neurologic: (-) numbness, (-) tingling. Psychiatric: (-) anxiety, (-) depression   Current Medication: Outpatient Encounter Medications as of 11/18/2018  Medication Sig  . amLODipine (NORVASC) 10 MG tablet Take 1 tablet (10 mg total) by mouth daily.  . CHANTIX CONTINUING MONTH PAK 1 MG tablet TAKE AS DIRECTED  . ezetimibe (ZETIA) 10 MG tablet TAKE 1 TABLET BY MOUTH ONCE DAILY  . furosemide (LASIX) 20 MG tablet Take 1 tablet (20 mg total) by mouth daily as needed.  Marland Kitchen losartan-hydrochlorothiazide (HYZAAR) 100-25 MG tablet Take 1 tablet by mouth daily.  . Metoprolol Succinate 100 MG CS24 Take 100 mg by mouth daily.  . simvastatin (ZOCOR) 20 MG tablet Take 1 tablet (20 mg total) by mouth daily.   No facility-administered encounter medications on file as of 11/18/2018.     Surgical History: Past Surgical History:  Procedure Laterality Date  . [place] History: Past Medical History:  Diagnosis Date  . Hyperlipidemia   . Hypertension    . Sleep apnea     Family History: Family History  Problem Relation Age of Onset  . Dementia Mother   . Heart murmur Mother   . Hypertension Father   . Gout Father   . Hyperlipidemia Father     Social History: Social History   Socioeconomic History  . Marital status: Married    Spouse name: Not on file  . Number of children: Not on file  . Years of education: Not on file  . Highest education level: Not on file  Occupational History  . Not on file  Social Needs  . Financial resource strain: Not on file  . Food insecurity:    Worry: Not on file    Inability: Not on file  . Transportation needs:    Medical: Not on file    Non-medical: Not on file  Tobacco Use  . Smoking status: Former Smoker    Last attempt to quit: 2016    Years since quitting: 4.0  . Smokeless tobacco: Never Used  Substance and Sexual Activity  . Alcohol use: Yes    Comment: occationally  . Drug use: Never  . Sexual activity: Not on file  Lifestyle  . Physical activity:    Days per week: Not on file    Minutes per session: Not on file  . Stress: Not on file  Relationships  . Social connections:    Talks on phone: Not on file    Gets together: Not on file    Attends religious service: Not on file    Active member of club  or organization: Not on file    Attends meetings of clubs or organizations: Not on file    Relationship status: Not on file  . Intimate partner violence:    Fear of current or ex partner: Not on file    Emotionally abused: Not on file    Physically abused: Not on file    Forced sexual activity: Not on file  Other Topics Concern  . Not on file  Social History Narrative  . Not on file    Vital Signs: Blood pressure 130/80, pulse 67, resp. rate 16, height 5\' 9"  (1.753 m), weight 230 lb (104.3 kg), SpO2 100 %.  Examination: General Appearance: The patient is well-developed, well-nourished, and in no distress. Skin: Gross inspection of skin unremarkable. Head:  normocephalic, no gross deformities. Eyes: no gross deformities noted. ENT: ears appear grossly normal no exudates. Neck: Supple. No thyromegaly. No LAD. Respiratory: no rhonchi noted at this time. Cardiovascular: Normal S1 and S2 without murmur or rub. Extremities: No cyanosis. pulses are equal. Neurologic: Alert and oriented. No involuntary movements.  LABS: No results found for this or any previous visit (from the past 2160 hour(s)).  Radiology: No results found.  No results found.  No results found.    Assessment and Plan: Patient Active Problem List   Diagnosis Date Noted  . Sleep apnea 03/18/2018  . Essential hypertension 03/18/2018  . Hyperlipidemia 03/18/2018    1. OSA on CPAP therapy will be continued. Good tolerance. Has new CPAP machine 2. COPD stable will continue present management. Not smoking on chantix 3. Morbid obesity needs to work on weight loss  General Counseling: I have discussed the findings of the evaluation and examination with Breanna Flores.  I have also discussed any further diagnostic evaluation thatmay be needed or ordered today. Breanna Flores verbalizes understanding of the findings of todays visit. We also reviewed her medications today and discussed drug interactions and side effects including but not limited excessive drowsiness and altered mental states. We also discussed that there is always a risk not just to her but also people around her. she has been encouraged to call the office with any questions or concerns that should arise related to todays visit.  Orders Placed This Encounter  Procedures  . Pulmonary function test    Standing Status:   Future    Standing Expiration Date:   11/19/2019    Order Specific Question:   Where should this test be performed?    Answer:   [facility]    Time spent: 41min  I have personally obtained a history, examined the patient, evaluated laboratory and imaging results, formulated the assessment and plan  and placed orders.    Breanna Gee, MD [facility] Pulmonary and Critical Care Sleep medicine

## 2018-11-18 NOTE — Patient Instructions (Signed)

## 2018-11-22 ENCOUNTER — Other Ambulatory Visit: Payer: Self-pay

## 2018-11-22 MED ORDER — SIMVASTATIN 20 MG PO TABS: 20.0000 mg | ORAL_TABLET | Freq: Every day | ORAL | 5 refills | Status: DC

## 2018-11-23 ENCOUNTER — Other Ambulatory Visit: Payer: Self-pay | Admitting: Nurse Practitioner

## 2018-11-23 MED ORDER — EZETIMIBE 10 MG PO TABS: 10.0000 mg | ORAL_TABLET | Freq: Every day | ORAL | 0 refills | Status: DC

## 2018-12-08 ENCOUNTER — Ambulatory Visit: Payer: Self-pay | Admitting: Internal Medicine

## 2018-12-13 ENCOUNTER — Encounter: Payer: Self-pay | Admitting: Nurse Practitioner

## 2018-12-13 ENCOUNTER — Ambulatory Visit (INDEPENDENT_AMBULATORY_CARE_PROVIDER_SITE_OTHER): Payer: Federal, State, Local not specified - PPO | Admitting: Nurse Practitioner

## 2018-12-13 VITALS — BP 150/78 | HR 64 | Resp 16 | Ht 69.0 in | Wt 233.6 lb

## 2018-12-13 DIAGNOSIS — Z124 Encounter for screening for malignant neoplasm of cervix: Secondary | ICD-10-CM

## 2018-12-13 DIAGNOSIS — R300 Dysuria: Secondary | ICD-10-CM | POA: Insufficient documentation

## 2018-12-13 DIAGNOSIS — I10 Essential (primary) hypertension: Secondary | ICD-10-CM

## 2018-12-13 DIAGNOSIS — Z202 Contact with and (suspected) exposure to infections with a predominantly sexual mode of transmission: Secondary | ICD-10-CM

## 2018-12-13 DIAGNOSIS — Z1239 Encounter for other screening for malignant neoplasm of breast: Secondary | ICD-10-CM

## 2018-12-13 DIAGNOSIS — F17210 Nicotine dependence, cigarettes, uncomplicated: Secondary | ICD-10-CM

## 2018-12-13 DIAGNOSIS — Z0001 Encounter for general adult medical examination with abnormal findings: Secondary | ICD-10-CM

## 2018-12-13 DIAGNOSIS — E782 Mixed hyperlipidemia: Secondary | ICD-10-CM | POA: Diagnosis not present

## 2018-12-13 MED ORDER — EZETIMIBE 10 MG PO TABS: 10.0000 mg | ORAL_TABLET | Freq: Every day | ORAL | 5 refills | Status: DC

## 2018-12-13 MED ORDER — LOSARTAN POTASSIUM-HCTZ 100-25 MG PO TABS: 1.0000 | ORAL_TABLET | Freq: Every day | ORAL | 5 refills | Status: DC

## 2018-12-13 MED ORDER — SIMVASTATIN 20 MG PO TABS: 20.0000 mg | ORAL_TABLET | Freq: Every day | ORAL | 5 refills | Status: DC

## 2018-12-13 MED ORDER — VARENICLINE TARTRATE 1 MG PO TABS: 1.0000 mg | ORAL_TABLET | ORAL | 3 refills | Status: DC

## 2018-12-13 MED ORDER — FUROSEMIDE 20 MG PO TABS: 20.0000 mg | ORAL_TABLET | Freq: Every day | ORAL | 5 refills | Status: DC | PRN

## 2018-12-13 MED ORDER — METOPROLOL SUCCINATE 100 MG PO CS24: 100.0000 mg | EXTENDED_RELEASE_CAPSULE | Freq: Every day | ORAL | 5 refills | Status: DC

## 2018-12-13 MED ORDER — AMLODIPINE BESYLATE 10 MG PO TABS: 10.0000 mg | ORAL_TABLET | Freq: Every day | ORAL | 6 refills | Status: DC

## 2018-12-13 NOTE — Addendum Note (Signed)
Addended by: Corlis Hove on: 12/13/2018 04:28 PM   Modules accepted: Orders

## 2018-12-13 NOTE — Progress Notes (Signed)
[facility] [place],  [id number]  Internal MEDICINE  Office Visit Note  Patient Name: Breanna Flores  [id number]  [id number]  Date of Service: 12/13/2018  Pt is here for routine health maintenance examination   Chief Complaint  Patient presents with  . Annual Exam    pt have concerns about some medications  . Quality Metric Gaps    mammo due, pt states insurance doesnt cover,   . Gynecologic Exam     The patient is here for health maintenance exam. She states that her work environment is becoming toxic. Her blood pressure is slightly elevated and she is having nearly daily headaches. She also is having more frequent nose bleeds and reactions to the air quality in her office. She states that all of the problems improve significantly during the weekends.  She continues to try to quit smoking. She uses chantix. This does help her curb her desire to smoke. With stress at work, she does have a hard time with this. Would like to continue with chantix as needed. She is due to have a pap smear. She would like to be screening for sexually transmitted infections. In addition to screening for gonorrhea, chlamydia, and trichomonas, she would like blood work to check her for HIV and syphilis. She has no vaginal dischare, pain, pelvic pain, or other symptoms indicating infection. She would just like to have peace of mind.    Current Medication: Outpatient Encounter Medications as of 12/13/2018  Medication Sig  . amLODipine (NORVASC) 10 MG tablet Take 1 tablet (10 mg total) by mouth daily.  Marland Kitchen ezetimibe (ZETIA) 10 MG tablet Take 1 tablet (10 mg total) by mouth daily.  . furosemide (LASIX) 20 MG tablet Take 1 tablet (20 mg total) by mouth daily as needed.  Marland Kitchen losartan-hydrochlorothiazide (HYZAAR) 100-25 MG tablet Take 1 tablet by mouth daily.  . Metoprolol Succinate 100 MG CS24 Take 100 mg by mouth daily.  . simvastatin (ZOCOR) 20 MG tablet Take 1 tablet (20 mg total)  by mouth daily.  . varenicline (CHANTIX CONTINUING MONTH PAK) 1 MG tablet Take 1 tablet (1 mg total) by mouth See admin instructions.  . [DISCONTINUED] amLODipine (NORVASC) 10 MG tablet Take 1 tablet (10 mg total) by mouth daily.  . [DISCONTINUED] CHANTIX CONTINUING MONTH PAK 1 MG tablet TAKE AS DIRECTED  . [DISCONTINUED] ezetimibe (ZETIA) 10 MG tablet Take 1 tablet (10 mg total) by mouth daily.  . [DISCONTINUED] furosemide (LASIX) 20 MG tablet Take 1 tablet (20 mg total) by mouth daily as needed.  . [DISCONTINUED] losartan-hydrochlorothiazide (HYZAAR) 100-25 MG tablet Take 1 tablet by mouth daily.  . [DISCONTINUED] Metoprolol Succinate 100 MG CS24 Take 100 mg by mouth daily.  . [DISCONTINUED] simvastatin (ZOCOR) 20 MG tablet Take 1 tablet (20 mg total) by mouth daily.   No facility-administered encounter medications on file as of 12/13/2018.     Surgical History: Past Surgical History:  Procedure Laterality Date  . [place] History: Past Medical History:  Diagnosis Date  . Hyperlipidemia   . Hypertension   . Sleep apnea     Family History: Family History  Problem Relation Age of Onset  . Dementia Mother   . Heart murmur Mother   . Hypertension Father   . Gout Father   . Hyperlipidemia Father       Review of Systems  Constitutional: Negative for activity change, chills, fatigue and unexpected weight change.  HENT:  Negative for congestion, postnasal drip, rhinorrhea, sneezing and sore throat.   Respiratory: Positive for shortness of breath and wheezing. Negative for cough and chest tightness.        Intermittent and well controlled with current medications  Cardiovascular: Negative for chest pain and palpitations.  Gastrointestinal: Negative for abdominal pain, constipation, diarrhea, nausea and vomiting.  Endocrine: Negative for heat intolerance, polydipsia and polyuria.  Genitourinary: Negative for dysuria, flank pain, frequency, genital sores,  pelvic pain, vaginal bleeding, vaginal discharge and vaginal pain.  Musculoskeletal: Negative for arthralgias, back pain, joint swelling and neck pain.  Skin: Negative for rash.  Allergic/Immunologic: Positive for environmental allergies.  Neurological: Negative for dizziness, tremors, numbness and headaches.  Hematological: Negative for adenopathy. Does not bruise/bleed easily.  Psychiatric/Behavioral: Negative for behavioral problems (Depression), sleep disturbance and suicidal ideas. The patient is nervous/anxious.      Today's Vitals   12/13/18 0954  BP: (!) 150/78  Pulse: 64  Resp: 16  SpO2: 100%  Weight: 233 lb 9.6 oz (106 kg)  Height: 5\' 9"  (1.753 m)   Body mass index is 34.5 kg/m.  Physical Exam Vitals signs and nursing note reviewed.  Constitutional:      General: She is not in acute distress.    Appearance: Normal appearance. She is well-developed. She is not diaphoretic.  HENT:     Head: Normocephalic and atraumatic.     Nose: Nose normal.     Mouth/Throat:     Pharynx: No oropharyngeal exudate.  Eyes:     Conjunctiva/sclera: Conjunctivae normal.     Pupils: Pupils are equal, round, and reactive to light.  Neck:     Musculoskeletal: Normal range of motion and neck supple.     Thyroid: No thyromegaly.     Vascular: No carotid bruit or JVD.     Trachea: No tracheal deviation.  Cardiovascular:     Rate and Rhythm: Normal rate and regular rhythm.     Pulses: Normal pulses.     Heart sounds: Murmur present. No friction rub. No gallop.   Pulmonary:     Effort: Pulmonary effort is normal. No respiratory distress.     Breath sounds: Normal breath sounds. No wheezing or rales.  Chest:     Chest wall: No tenderness.     Breasts:        Right: Normal. No swelling, bleeding, inverted nipple, mass, nipple discharge, skin change or tenderness.        Left: Normal. No swelling, bleeding, inverted nipple, mass, nipple discharge, skin change or tenderness.  Abdominal:      General: Bowel sounds are normal.     Palpations: Abdomen is soft.     Tenderness: There is no abdominal tenderness.  Genitourinary:    General: Normal vulva.     Exam position: Supine.     Vagina: Normal.     Cervix: No cervical motion tenderness, discharge or erythema.     Uterus: Normal.      Adnexa: Right adnexa normal.     Comments: No tenderness, masses, or organomeglay present during bimanual exam . Musculoskeletal: Normal range of motion.  Lymphadenopathy:     Cervical: No cervical adenopathy.  Skin:    General: Skin is warm and dry.     Capillary Refill: Capillary refill takes less than 2 seconds.  Neurological:     General: No focal deficit present.     Mental Status: She is alert and oriented to person, place, and time.  Cranial Nerves: No cranial nerve deficit.  Psychiatric:        Behavior: Behavior normal.        Thought Content: Thought content normal.        Judgment: Judgment normal.    Assessment/Plan: 1. Encounter for general adult medical examination with abnormal findings Annual health maintenance exam with pap smear today.  2. Essential hypertension Generally stable. Continue bp medication as prescribed. Check routine, fasting labs.  - amLODipine (NORVASC) 10 MG tablet; Take 1 tablet (10 mg total) by mouth daily.  Dispense: 30 tablet; Refill: 6 - furosemide (LASIX) 20 MG tablet; Take 1 tablet (20 mg total) by mouth daily as needed.  Dispense: 30 tablet; Refill: 5 - losartan-hydrochlorothiazide (HYZAAR) 100-25 MG tablet; Take 1 tablet by mouth daily.  Dispense: 30 tablet; Refill: 5 - Metoprolol Succinate 100 MG CS24; Take 100 mg by mouth daily.  Dispense: 30 capsule; Refill: 5  3. Mixed hyperlipidemia Check routine, fasting labs. Adjust cholesterol medications as indicated.  - ezetimibe (ZETIA) 10 MG tablet; Take 1 tablet (10 mg total) by mouth daily.  Dispense: 30 tablet; Refill: 5 - simvastatin (ZOCOR) 20 MG tablet; Take 1 tablet (20 mg total) by  mouth daily.  Dispense: 30 tablet; Refill: 5  4. Routine cervical smear - Pap IG and HPV (high risk) DNA detection  5. Screening for breast cancer - MM DIGITAL SCREENING BILATERAL; Future  6. Cigarette smoker - varenicline (CHANTIX CONTINUING MONTH PAK) 1 MG tablet; Take 1 tablet (1 mg total) by mouth See admin instructions.  Dispense: 56 tablet; Refill: 3  7. Dysuria - UA/M w/rflx Culture, Routine  8. Exposure to sexually transmitted disease (STD) Samples taken today to assess for gonorrhea, chlamydia, and trichomonas. Will check HIV and RPR with blood work. Notify patient of results when available.   General Counseling: Breanna Flores verbalizes understanding of the findings of todays visit and agrees with plan of treatment. I have discussed any further diagnostic evaluation that may be needed or ordered today. We also reviewed her medications today. she has been encouraged to call the office with any questions or concerns that should arise related to todays visit.    Counseling:  Hypertension Counseling:   The following hypertensive lifestyle modification were recommended and discussed:  1. Limiting alcohol intake to less than 1 oz/day of ethanol:(24 oz of beer or 8 oz of wine or 2 oz of 100-proof whiskey). 2. Take baby ASA 81 mg daily. 3. Importance of regular aerobic exercise and losing weight. 4. Reduce dietary saturated fat and cholesterol intake for overall cardiovascular health. 5. Maintaining adequate dietary potassium, calcium, and magnesium intake. 6. Regular monitoring of the blood pressure. 7. Reduce sodium intake to less than 100 mmol/day (less than 2.3 gm of sodium or less than 6 gm of sodium choride)   This patient was seen by [place] with Dr Lavera Guise as a part of collaborative care agreement  Orders Placed This Encounter  Procedures  . MM DIGITAL SCREENING BILATERAL  . UA/M w/rflx Culture, Routine    Meds ordered this encounter   Medications  . amLODipine (NORVASC) 10 MG tablet    Sig: Take 1 tablet (10 mg total) by mouth daily.    Dispense:  30 tablet    Refill:  6    Order Specific Question:   Supervising Provider    Answer:   Lavera Guise [[id number]]  . ezetimibe (ZETIA) 10 MG tablet    Sig: Take 1 tablet (  10 mg total) by mouth daily.    Dispense:  30 tablet    Refill:  5    Order Specific Question:   Supervising Provider    Answer:   Lavera Guise [[id number]]  . furosemide (LASIX) 20 MG tablet    Sig: Take 1 tablet (20 mg total) by mouth daily as needed.    Dispense:  30 tablet    Refill:  5    Order Specific Question:   Supervising Provider    Answer:   Lavera Guise [[id number]]  . losartan-hydrochlorothiazide (HYZAAR) 100-25 MG tablet    Sig: Take 1 tablet by mouth daily.    Dispense:  30 tablet    Refill:  5    Order Specific Question:   Supervising Provider    Answer:   Lavera Guise [[id number]]  . Metoprolol Succinate 100 MG CS24    Sig: Take 100 mg by mouth daily.    Dispense:  30 capsule    Refill:  5    Order Specific Question:   Supervising Provider    Answer:   Lavera Guise [[id number]]  . simvastatin (ZOCOR) 20 MG tablet    Sig: Take 1 tablet (20 mg total) by mouth daily.    Dispense:  30 tablet    Refill:  5    Order Specific Question:   Supervising Provider    Answer:   Lavera Guise [[id number]]  . varenicline (CHANTIX CONTINUING MONTH PAK) 1 MG tablet    Sig: Take 1 tablet (1 mg total) by mouth See admin instructions.    Dispense:  56 tablet    Refill:  3    Order Specific Question:   Supervising Provider    Answer:   Lavera Guise [[id number]]    Time spent: [place], MD  Internal Medicine

## 2018-12-14 LAB — MICROSCOPIC EXAMINATION: Casts: NONE SEEN /lpf

## 2018-12-14 LAB — UA/M W/RFLX CULTURE, ROUTINE
BILIRUBIN UA: NEGATIVE
GLUCOSE, UA: NEGATIVE
Ketones, UA: NEGATIVE
Leukocytes, UA: NEGATIVE
Nitrite, UA: NEGATIVE
PROTEIN UA: NEGATIVE
RBC, UA: NEGATIVE
SPEC GRAV UA: 1.011 (ref 1.005–1.030)
UUROB: 0.2 mg/dL (ref 0.2–1.0)
pH, UA: 7 (ref 5.0–7.5)

## 2018-12-15 ENCOUNTER — Other Ambulatory Visit: Payer: Self-pay | Admitting: Nurse Practitioner

## 2018-12-15 ENCOUNTER — Telehealth: Payer: Self-pay

## 2018-12-15 DIAGNOSIS — N760 Acute vaginitis: Secondary | ICD-10-CM

## 2018-12-15 DIAGNOSIS — B9689 Other specified bacterial agents as the cause of diseases classified elsewhere: Secondary | ICD-10-CM

## 2018-12-15 MED ORDER — METRONIDAZOLE 0.75 % VA GEL: 1.0000 | Freq: Every day | VAGINAL | 0 refills | Status: DC

## 2018-12-15 NOTE — Telephone Encounter (Signed)
lmom to call us back

## 2018-12-15 NOTE — Progress Notes (Signed)
Bacterial vaginosis on specimen taken at her physical. Added metronidazole vaginal gel to use applicatorful at bedtime for next 7 nights. Waiting on other lab results.

## 2018-12-15 NOTE — Telephone Encounter (Signed)
PT ADVISED FOR RESULT FOR NUSWAB AND SEE OTHER NOTE

## 2018-12-16 LAB — PAP IG AND HPV HIGH-RISK: HPV, high-risk: NEGATIVE

## 2018-12-16 LAB — NUSWAB VAGINITIS PLUS (VG+)
ATOPOBIUM VAGINAE: HIGH {score} — AB
BVAB 2: HIGH Score — AB
CANDIDA GLABRATA, NAA: NEGATIVE
Candida albicans, NAA: NEGATIVE
Chlamydia trachomatis, NAA: NEGATIVE
MEGASPHAERA 1: HIGH {score} — AB
Neisseria gonorrhoeae, NAA: NEGATIVE
Trich vag by NAA: NEGATIVE

## 2018-12-29 ENCOUNTER — Ambulatory Visit: Payer: Self-pay | Admitting: Internal Medicine

## 2019-01-19 ENCOUNTER — Ambulatory Visit: Payer: Self-pay | Admitting: Internal Medicine

## 2019-02-16 ENCOUNTER — Ambulatory Visit: Payer: Self-pay | Admitting: Internal Medicine

## 2019-03-16 ENCOUNTER — Ambulatory Visit: Payer: Federal, State, Local not specified - PPO | Admitting: Internal Medicine

## 2019-03-16 ENCOUNTER — Other Ambulatory Visit: Payer: Self-pay

## 2019-03-16 DIAGNOSIS — J449 Chronic obstructive pulmonary disease, unspecified: Principal | ICD-10-CM

## 2019-03-16 DIAGNOSIS — R0602 Shortness of breath: Secondary | ICD-10-CM

## 2019-03-16 LAB — PULMONARY FUNCTION TEST

## 2019-03-23 NOTE — Procedures (Signed)
[facility] [place], [address]  DATE OF SERVICE: Mar 16, 2019  Complete Pulmonary Function Testing Interpretation:  FINDINGS:  The forced vital capacity is mildly decreased.  The FEV1 is 2.01 L which is 69% of predicted and is mildly decreased.  FEV1 FVC ratio is mildly decreased.  Postbronchodilator there is again no significant improvement in the FEV1.  Total lung capacity is normal residual volume is normal residual volume total lung capacity ratio is increased.  DLCO is mildly decreased and normal when corrected for alveolar volume.  IMPRESSION:  This pulmonary function study shows mild obstructive sleep apnea.  There is mild reduction of the DLCO which is normal when corrected for alveolar volume  Allyne Gee, MD [facility] Medicine Sleep Medicine

## 2019-05-23 ENCOUNTER — Ambulatory Visit: Payer: Self-pay | Admitting: Internal Medicine

## 2019-06-02 ENCOUNTER — Other Ambulatory Visit: Payer: Self-pay

## 2019-06-02 DIAGNOSIS — E782 Mixed hyperlipidemia: Secondary | ICD-10-CM

## 2019-06-02 MED ORDER — EZETIMIBE 10 MG PO TABS: 10.0000 mg | ORAL_TABLET | Freq: Every day | ORAL | 5 refills | Status: DC

## 2019-06-10 ENCOUNTER — Ambulatory Visit: Payer: Self-pay | Admitting: Nurse Practitioner

## 2019-06-13 ENCOUNTER — Ambulatory Visit: Payer: Self-pay | Admitting: Nurse Practitioner

## 2019-06-23 ENCOUNTER — Other Ambulatory Visit: Payer: Self-pay

## 2019-06-23 ENCOUNTER — Encounter: Payer: Self-pay | Admitting: Internal Medicine

## 2019-06-23 ENCOUNTER — Ambulatory Visit: Payer: Federal, State, Local not specified - PPO | Admitting: Internal Medicine

## 2019-06-23 DIAGNOSIS — I10 Essential (primary) hypertension: Secondary | ICD-10-CM | POA: Diagnosis not present

## 2019-06-23 DIAGNOSIS — F17210 Nicotine dependence, cigarettes, uncomplicated: Secondary | ICD-10-CM

## 2019-06-23 DIAGNOSIS — J449 Chronic obstructive pulmonary disease, unspecified: Secondary | ICD-10-CM

## 2019-06-23 MED ORDER — BUDESONIDE-FORMOTEROL FUMARATE 160-4.5 MCG/ACT IN AERO
2.0000 | INHALATION_SPRAY | Freq: Two times a day (BID) | RESPIRATORY_TRACT | 3 refills | Status: DC
Start: 2019-06-23 — End: 2021-01-17

## 2019-06-23 MED ORDER — ALBUTEROL SULFATE HFA 108 (90 BASE) MCG/ACT IN AERS: 2.0000 | INHALATION_SPRAY | Freq: Four times a day (QID) | RESPIRATORY_TRACT | 2 refills | Status: DC | PRN

## 2019-06-23 NOTE — Progress Notes (Signed)
[facility] [place], [place] [address]  Internal MEDICINE  Telephone Visit  Patient Name: Breanna Flores  [id number]  [id number]  Date of Service: 06/23/2019  I connected with the patient at 1134 by telephone and verified the patients identity using two identifiers.   I discussed the limitations, risks, security and privacy concerns of performing an evaluation and management service by telephone and the availability of in person appointments. I also discussed with the patient that there may be a patient responsible charge related to the service.  The patient expressed understanding and agrees to proceed.    Chief Complaint  Patient presents with  . Telephone Screen  . Sleep Apnea    6 month follow up pft results   . Shortness of Breath  . Telephone Assessment    HPI  PT seen via video to review PFT results.  Her PFT shows mild obstructive lung disease.  She reports overall she has been doing well.  She has some mild shortness of breath from the pollen, at times.     Current Medication: Outpatient Encounter Medications as of 06/23/2019  Medication Sig  . amLODipine (NORVASC) 10 MG tablet Take 1 tablet (10 mg total) by mouth daily.  Marland Kitchen ezetimibe (ZETIA) 10 MG tablet Take 1 tablet (10 mg total) by mouth daily.  . furosemide (LASIX) 20 MG tablet Take 1 tablet (20 mg total) by mouth daily as needed.  Marland Kitchen losartan-hydrochlorothiazide (HYZAAR) 100-25 MG tablet Take 1 tablet by mouth daily.  . Metoprolol Succinate 100 MG CS24 Take 100 mg by mouth daily.  . metroNIDAZOLE (METROGEL) 0.75 % vaginal gel Place 1 Applicatorful vaginally at bedtime.  . NON FORMULARY cpap device  . simvastatin (ZOCOR) 20 MG tablet Take 1 tablet (20 mg total) by mouth daily.  . varenicline (CHANTIX CONTINUING MONTH PAK) 1 MG tablet Take 1 tablet (1 mg total) by mouth See admin instructions.   No facility-administered encounter medications on file as of 06/23/2019.     Surgical  History: Past Surgical History:  Procedure Laterality Date  . [place] History: Past Medical History:  Diagnosis Date  . Hyperlipidemia   . Hypertension   . Sleep apnea     Family History: Family History  Problem Relation Age of Onset  . Dementia Mother   . Heart murmur Mother   . Hypertension Father   . Gout Father   . Hyperlipidemia Father     Social History   Socioeconomic History  . Marital status: Married    Spouse name: Not on file  . Number of children: Not on file  . Years of education: Not on file  . Highest education level: Not on file  Occupational History  . Not on file  Social Needs  . Financial resource strain: Not on file  . Food insecurity    Worry: Not on file    Inability: Not on file  . Transportation needs    Medical: Not on file    Non-medical: Not on file  Tobacco Use  . Smoking status: Former Smoker    Quit date: 2016    Years since quitting: 4.6  . Smokeless tobacco: Never Used  Substance and Sexual Activity  . Alcohol use: Yes    Comment: occasionally  . Drug use: Never  . Sexual activity: Not on file  Lifestyle  . Physical activity    Days per week: Not on file    Minutes per  session: Not on file  . Stress: Not on file  Relationships  . Social Herbalist on phone: Not on file    Gets together: Not on file    Attends religious service: Not on file    Active member of club or organization: Not on file    Attends meetings of clubs or organizations: Not on file    Relationship status: Not on file  . Intimate partner violence    Fear of current or ex partner: Not on file    Emotionally abused: Not on file    Physically abused: Not on file    Forced sexual activity: Not on file  Other Topics Concern  . Not on file  Social History Narrative  . Not on file      Review of Systems  Constitutional: Negative for chills, fatigue and unexpected weight change.  HENT: Negative for congestion,  rhinorrhea, sneezing and sore throat.   Eyes: Negative for photophobia, pain and redness.  Respiratory: Negative for cough, chest tightness and shortness of breath.   Cardiovascular: Negative for chest pain and palpitations.  Gastrointestinal: Negative for abdominal pain, constipation, diarrhea, nausea and vomiting.  Endocrine: Negative.   Genitourinary: Negative for dysuria and frequency.  Musculoskeletal: Negative for arthralgias, back pain, joint swelling and neck pain.  Skin: Negative for rash.  Allergic/Immunologic: Negative.   Neurological: Negative for tremors and numbness.  Hematological: Negative for adenopathy. Does not bruise/bleed easily.  Psychiatric/Behavioral: Negative for behavioral problems and sleep disturbance. The patient is not nervous/anxious.     Vital Signs: There were no vitals taken for this visit.   Observation/Objective:  Well appearing, nad noted.    Assessment/Plan: 1. Obstructive chronic bronchitis without exacerbation ([place]) Will start patient on albuterol rescue inhaler, and daily Symbicort.  Follow up in a few weeks.  - albuterol (VENTOLIN HFA) 108 (90 Base) MCG/ACT inhaler; Inhale 2 puffs into the lungs every 6 (six) hours as needed for wheezing or shortness of breath.  Dispense: 18 g; Refill: 2 - budesonide-formoterol (SYMBICORT) 160-4.5 MCG/ACT inhaler; Inhale 2 puffs into the lungs 2 (two) times daily.  Dispense: 1 Inhaler; Refill: 3  2. Essential hypertension BP is stable, continue current medications.   3. Cigarette smoker Smoking cessation counseling: 1. Pt acknowledges the risks of long term smoking, she will try to quite smoking. 2. Options for different medications including nicotine products, chewing gum, patch etc, Wellbutrin and Chantix is discussed 3. Goal and date of compete cessation is discussed 4. Total time spent in smoking cessation is 15 min.   General Counseling: Breanna Flores verbalizes understanding of the findings of today's  phone visit and agrees with plan of treatment. I have discussed any further diagnostic evaluation that may be needed or ordered today. We also reviewed her medications today. she has been encouraged to call the office with any questions or concerns that should arise related to todays visit.    No orders of the defined types were placed in this encounter.   No orders of the defined types were placed in this encounter.   Time spent: 12 Minutes    Orson Gear [facility] Pulmonary Medicine

## 2019-06-27 ENCOUNTER — Other Ambulatory Visit: Payer: Self-pay

## 2019-06-27 DIAGNOSIS — I10 Essential (primary) hypertension: Secondary | ICD-10-CM

## 2019-06-27 MED ORDER — METOPROLOL SUCCINATE 100 MG PO CS24: 100.0000 mg | EXTENDED_RELEASE_CAPSULE | Freq: Every day | ORAL | 3 refills | Status: DC

## 2019-07-01 ENCOUNTER — Ambulatory Visit: Payer: Federal, State, Local not specified - PPO | Admitting: Nurse Practitioner

## 2019-07-01 ENCOUNTER — Other Ambulatory Visit: Payer: Self-pay

## 2019-07-01 ENCOUNTER — Encounter: Payer: Self-pay | Admitting: Nurse Practitioner

## 2019-07-01 VITALS — Ht 69.0 in

## 2019-07-01 DIAGNOSIS — E782 Mixed hyperlipidemia: Secondary | ICD-10-CM

## 2019-07-01 DIAGNOSIS — G4730 Sleep apnea, unspecified: Secondary | ICD-10-CM

## 2019-07-01 DIAGNOSIS — I10 Essential (primary) hypertension: Secondary | ICD-10-CM | POA: Diagnosis not present

## 2019-07-01 DIAGNOSIS — J4489 Other specified chronic obstructive pulmonary disease: Secondary | ICD-10-CM

## 2019-07-01 DIAGNOSIS — K529 Noninfective gastroenteritis and colitis, unspecified: Secondary | ICD-10-CM | POA: Diagnosis not present

## 2019-07-01 DIAGNOSIS — J449 Chronic obstructive pulmonary disease, unspecified: Secondary | ICD-10-CM | POA: Diagnosis not present

## 2019-07-01 NOTE — Progress Notes (Signed)
[facility] [place], [place] [address]  Internal MEDICINE  Telephone Visit  Patient Name: Breanna Flores  [id number]  [id number]  Date of Service: 07/01/2019  I connected with the patient at 12:10pm by webcam and verified the patients identity using two identifiers.   I discussed the limitations, risks, security and privacy concerns of performing an evaluation and management service by webcam  and the availability of in person appointments. I also discussed with the patient that there may be a patient responsible charge related to the service.  The patient expressed understanding and agrees to proceed.    Chief Complaint  Patient presents with  . Telephone Assessment  . Telephone Screen  . Hypertension  . Hyperlipidemia  . Diarrhea  . Nausea  . Abdominal Pain    The patient has been contacted via webcam for follow up visit due to concerns for spread of novel coronavirus. Today, the patient states that she has had issues with cramping and diarrhea this week. She has also had some nausea with no vomiting. She states that she feels that stress from work may be worsening the issue. Gradually this is improving. She has no other concerns or complaints. Continues to work from home due to spread of covid 19. 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 Overall, she is doig well. She has no other concerns or complaints today.       Current Medication: Outpatient Encounter Medications as of 07/01/2019  Medication Sig  . albuterol (VENTOLIN HFA) 108 (90 Base) MCG/ACT inhaler Inhale 2 puffs into the lungs every 6 (six) hours as needed for wheezing or shortness of breath.  Marland Kitchen amLODipine (NORVASC) 10 MG tablet Take 1 tablet (10 mg total) by mouth daily.  . budesonide-formoterol (SYMBICORT) 160-4.5 MCG/ACT inhaler Inhale 2 puffs into the lungs 2 (two) times daily.  Marland Kitchen ezetimibe (ZETIA) 10 MG tablet Take 1 tablet (10 mg total) by mouth daily.  . furosemide  (LASIX) 20 MG tablet Take 1 tablet (20 mg total) by mouth daily as needed.  Marland Kitchen losartan-hydrochlorothiazide (HYZAAR) 100-25 MG tablet Take 1 tablet by mouth daily.  . Metoprolol Succinate 100 MG CS24 Take 100 mg by mouth daily.  . NON FORMULARY cpap device  . simvastatin (ZOCOR) 20 MG tablet Take 1 tablet (20 mg total) by mouth daily.  . varenicline (CHANTIX CONTINUING MONTH PAK) 1 MG tablet Take 1 tablet (1 mg total) by mouth See admin instructions. (Patient not taking: Reported on 07/01/2019)  . [DISCONTINUED] metroNIDAZOLE (METROGEL) 0.75 % vaginal gel Place 1 Applicatorful vaginally at bedtime. (Patient not taking: Reported on 07/01/2019)   No facility-administered encounter medications on file as of 07/01/2019.     Surgical History: Past Surgical History:  Procedure Laterality Date  . [place] History: Past Medical History:  Diagnosis Date  . Hyperlipidemia   . Hypertension   . Sleep apnea     Family History: Family History  Problem Relation Age of Onset  . Dementia Mother   . Heart murmur Mother   . Hypertension Father   . Gout Father   . Hyperlipidemia Father     Social History   Socioeconomic History  . Marital status: Married    Spouse name: Not on file  . Number of children: Not on file  . Years of education: Not on file  . Highest education level: Not on file  Occupational History  . Not  on file  Social Needs  . Financial resource strain: Not on file  . Food insecurity    Worry: Not on file    Inability: Not on file  . Transportation needs    Medical: Not on file    Non-medical: Not on file  Tobacco Use  . Smoking status: Former Smoker    Quit date: 2016    Years since quitting: 4.6  . Smokeless tobacco: Never Used  Substance and Sexual Activity  . Alcohol use: Yes    Comment: rarely  . Drug use: Never  . Sexual activity: Not on file  Lifestyle  . Physical activity    Days per week: Not on file    Minutes per session:  Not on file  . Stress: Not on file  Relationships  . Social Herbalist on phone: Not on file    Gets together: Not on file    Attends religious service: Not on file    Active member of club or organization: Not on file    Attends meetings of clubs or organizations: Not on file    Relationship status: Not on file  . Intimate partner violence    Fear of current or ex partner: Not on file    Emotionally abused: Not on file    Physically abused: Not on file    Forced sexual activity: Not on file  Other Topics Concern  . Not on file  Social History Narrative  . Not on file      Review of Systems  Constitutional: Positive for appetite change and fatigue. Negative for chills and unexpected weight change.  HENT: Negative for congestion, postnasal drip, rhinorrhea, sneezing and sore throat.   Respiratory: Negative for cough, chest tightness and shortness of breath.   Cardiovascular: Negative for chest pain and palpitations.  Gastrointestinal: Positive for abdominal pain, diarrhea and nausea. Negative for constipation and vomiting.  Endocrine: Negative for cold intolerance, heat intolerance, polydipsia and polyuria.  Musculoskeletal: Negative for arthralgias, back pain, joint swelling and neck pain.  Skin: Negative for rash.  Neurological: Negative for dizziness, tremors, numbness and headaches.  Hematological: Negative for adenopathy. Does not bruise/bleed easily.  Psychiatric/Behavioral: Negative for behavioral problems (Depression), sleep disturbance and suicidal ideas. The patient is not nervous/anxious.     Today's Vitals   07/01/19 1047  Height: 5\' 9"  (1.753 m)   Body mass index is 34.5 kg/m.  Observation/Objective:  Marland Kitchen The patient is alert and oriented. She is pleasant and answers all questions appropriately. Breathing is non-labored. She is in no acute distress at this time.    Assessment/Plan:  1. Gastroenteritis Likely viral or stress related. Recommend  OTC anti-diarrheal medication as needed as well as bland diet.   2. Essential hypertension Stable. Continue BP medication as prescribed   3. Obstructive chronic bronchitis without exacerbation ([place]) Continue inhalers and respiratory medication as prescribed   4. Mixed hyperlipidemia Continue simvastatin and zetia as prescribed   5. Sleep apnea, unspecified type Regular visits with Dr. Devona Konig for CPAP management.   General Counseling: Krystalle verbalizes understanding of the findings of today's phone visit and agrees with plan of treatment. I have discussed any further diagnostic evaluation that may be needed or ordered today. We also reviewed her medications today. she has been encouraged to call the office with any questions or concerns that should arise related to todays visit.  Hypertension Counseling:   The following hypertensive lifestyle modification were recommended and discussed:  1.  Limiting alcohol intake to less than 1 oz/day of ethanol:(24 oz of beer or 8 oz of wine or 2 oz of 100-proof whiskey). 2. Take baby ASA 81 mg daily. 3. Importance of regular aerobic exercise and losing weight. 4. Reduce dietary saturated fat and cholesterol intake for overall cardiovascular health. 5. Maintaining adequate dietary potassium, calcium, and magnesium intake. 6. Regular monitoring of the blood pressure. 7. Reduce sodium intake to less than 100 mmol/day (less than 2.3 gm of sodium or less than 6 gm of sodium choride)   This patient was seen by [place] with Dr Lavera Guise as a part of collaborative care agreement  Time spent: 67 Minutes    Dr Lavera Guise Internal medicine

## 2019-08-01 ENCOUNTER — Other Ambulatory Visit: Payer: Self-pay | Admitting: Nurse Practitioner

## 2019-08-01 DIAGNOSIS — F17210 Nicotine dependence, cigarettes, uncomplicated: Secondary | ICD-10-CM

## 2019-08-01 MED ORDER — VARENICLINE TARTRATE 1 MG PO TABS: 1.0000 mg | ORAL_TABLET | ORAL | 2 refills | Status: DC

## 2019-08-01 NOTE — Progress Notes (Signed)
Refilled prescription for chantix continuing month pack per pharmacy request

## 2019-08-08 ENCOUNTER — Other Ambulatory Visit: Payer: Self-pay

## 2019-08-08 DIAGNOSIS — I10 Essential (primary) hypertension: Secondary | ICD-10-CM

## 2019-08-08 MED ORDER — AMLODIPINE BESYLATE 10 MG PO TABS: 10.0000 mg | ORAL_TABLET | Freq: Every day | ORAL | 6 refills | Status: DC

## 2019-09-01 ENCOUNTER — Other Ambulatory Visit: Payer: Self-pay | Admitting: Nurse Practitioner

## 2019-09-01 DIAGNOSIS — I10 Essential (primary) hypertension: Secondary | ICD-10-CM

## 2019-09-01 MED ORDER — FUROSEMIDE 20 MG PO TABS: 20.0000 mg | ORAL_TABLET | Freq: Every day | ORAL | 5 refills | Status: DC | PRN

## 2019-09-22 ENCOUNTER — Telehealth: Payer: Self-pay

## 2019-09-22 NOTE — Telephone Encounter (Signed)
Called lmom informing patient of appointment. klh 

## 2019-09-26 ENCOUNTER — Other Ambulatory Visit: Payer: Self-pay

## 2019-09-26 ENCOUNTER — Encounter: Payer: Self-pay | Admitting: Internal Medicine

## 2019-09-26 ENCOUNTER — Ambulatory Visit: Payer: Federal, State, Local not specified - PPO | Admitting: Internal Medicine

## 2019-09-26 DIAGNOSIS — K529 Noninfective gastroenteritis and colitis, unspecified: Secondary | ICD-10-CM | POA: Diagnosis not present

## 2019-09-26 DIAGNOSIS — I10 Essential (primary) hypertension: Secondary | ICD-10-CM

## 2019-09-26 DIAGNOSIS — J449 Chronic obstructive pulmonary disease, unspecified: Secondary | ICD-10-CM | POA: Diagnosis not present

## 2019-09-26 DIAGNOSIS — G4733 Obstructive sleep apnea (adult) (pediatric): Secondary | ICD-10-CM

## 2019-09-26 DIAGNOSIS — Z9989 Dependence on other enabling machines and devices: Secondary | ICD-10-CM

## 2019-09-26 NOTE — Progress Notes (Signed)
[facility] [place], [place] [address]  Internal MEDICINE  Telephone Visit  Patient Name: Breanna Flores  [id number]  [id number]  Date of Service: 09/26/2019  I connected with the patient at 1234 by telephone and verified the patients identity using two identifiers.   I discussed the limitations, risks, security and privacy concerns of performing an evaluation and management service by telephone and the availability of in person appointments. I also discussed with the patient that there may be a patient responsible charge related to the service.  The patient expressed understanding and agrees to proceed.    Chief Complaint  Patient presents with  . Follow-up    HPI  Pt seen via telephone for follow up. She denies any significant issues at this time. She has been trying to stay home, and social distance due to Covid.  She denies recent hospitalizations.  Pt reports good compliance with CPAP therapy. Cleaning machine by hand, and changing filters and tubing as directed. Denies headaches, sinus issues, palpitations, or hemoptysis.       Current Medication: Outpatient Encounter Medications as of 09/26/2019  Medication Sig  . albuterol (VENTOLIN HFA) 108 (90 Base) MCG/ACT inhaler Inhale 2 puffs into the lungs every 6 (six) hours as needed for wheezing or shortness of breath.  Marland Kitchen amLODipine (NORVASC) 10 MG tablet Take 1 tablet (10 mg total) by mouth daily.  . budesonide-formoterol (SYMBICORT) 160-4.5 MCG/ACT inhaler Inhale 2 puffs into the lungs 2 (two) times daily.  Marland Kitchen ezetimibe (ZETIA) 10 MG tablet Take 1 tablet (10 mg total) by mouth daily.  . furosemide (LASIX) 20 MG tablet Take 1 tablet (20 mg total) by mouth daily as needed.  Marland Kitchen losartan-hydrochlorothiazide (HYZAAR) 100-25 MG tablet Take 1 tablet by mouth daily.  . Metoprolol Succinate 100 MG CS24 Take 100 mg by mouth daily.  . NON FORMULARY cpap device  . simvastatin (ZOCOR) 20 MG tablet Take 1 tablet (20 mg  total) by mouth daily.  . varenicline (CHANTIX CONTINUING MONTH PAK) 1 MG tablet Take 1 tablet (1 mg total) by mouth See admin instructions.   No facility-administered encounter medications on file as of 09/26/2019.     Surgical History: Past Surgical History:  Procedure Laterality Date  . [place] History: Past Medical History:  Diagnosis Date  . Hyperlipidemia   . Hypertension   . Sleep apnea     Family History: Family History  Problem Relation Age of Onset  . Dementia Mother   . Heart murmur Mother   . Hypertension Father   . Gout Father   . Hyperlipidemia Father     Social History   Socioeconomic History  . Marital status: Married    Spouse name: Not on file  . Number of children: Not on file  . Years of education: Not on file  . Highest education level: Not on file  Occupational History  . Not on file  Social Needs  . Financial resource strain: Not on file  . Food insecurity    Worry: Not on file    Inability: Not on file  . Transportation needs    Medical: Not on file    Non-medical: Not on file  Tobacco Use  . Smoking status: Former Smoker    Quit date: 2016    Years since quitting: 4.8  . Smokeless tobacco: Never Used  Substance and Sexual Activity  . Alcohol use: Yes    Comment: rarely  .  Drug use: Never  . Sexual activity: Not on file  Lifestyle  . Physical activity    Days per week: Not on file    Minutes per session: Not on file  . Stress: Not on file  Relationships  . Social Herbalist on phone: Not on file    Gets together: Not on file    Attends religious service: Not on file    Active member of club or organization: Not on file    Attends meetings of clubs or organizations: Not on file    Relationship status: Not on file  . Intimate partner violence    Fear of current or ex partner: Not on file    Emotionally abused: Not on file    Physically abused: Not on file    Forced sexual activity:  Not on file  Other Topics Concern  . Not on file  Social History Narrative  . Not on file      Review of Systems  Vital Signs: There were no vitals taken for this visit.   Observation/Objective: Well sounding, nad noted.    Assessment/Plan: 1. Obstructive chronic bronchitis without exacerbation ([place]) Controlled.  Continue to use inhalers as directed, follow up as scheduled.   2. OSA on CPAP Good compliance, continue present management.   3. Morbid obesity ([place]) Obesity Counseling: Risk Assessment: An assessment of behavioral risk factors was made today and includes lack of exercise sedentary lifestyle, lack of portion control and poor dietary habits.  Risk Modification Advice: She was counseled on portion control guidelines. Restricting daily caloric intake to. . The detrimental long term effects of obesity on her health and ongoing poor compliance was also discussed with the patient.  4. Gastroenteritis Stable, continue current management.   5. Essential hypertension Stable, continue present therapy.  General Counseling: Iria verbalizes understanding of the findings of today's phone visit and agrees with plan of treatment. I have discussed any further diagnostic evaluation that may be needed or ordered today. We also reviewed her medications today. she has been encouraged to call the office with any questions or concerns that should arise related to todays visit.    No orders of the defined types were placed in this encounter.   No orders of the defined types were placed in this encounter.   Time spent: 15 Minutes    Orson Gear AGNP-C Pulmonary medicine.

## 2019-10-03 ENCOUNTER — Other Ambulatory Visit: Payer: Self-pay

## 2019-10-03 DIAGNOSIS — I10 Essential (primary) hypertension: Secondary | ICD-10-CM

## 2019-10-03 MED ORDER — LOSARTAN POTASSIUM-HCTZ 100-25 MG PO TABS: 1.0000 | ORAL_TABLET | Freq: Every day | ORAL | 5 refills | Status: DC

## 2019-10-25 ENCOUNTER — Other Ambulatory Visit: Payer: Self-pay

## 2019-10-25 DIAGNOSIS — I10 Essential (primary) hypertension: Secondary | ICD-10-CM

## 2019-10-25 DIAGNOSIS — E782 Mixed hyperlipidemia: Secondary | ICD-10-CM

## 2019-10-25 MED ORDER — SIMVASTATIN 20 MG PO TABS: 20.0000 mg | ORAL_TABLET | Freq: Every day | ORAL | 5 refills | Status: DC

## 2019-10-25 MED ORDER — METOPROLOL SUCCINATE 100 MG PO CS24: 100.0000 mg | EXTENDED_RELEASE_CAPSULE | Freq: Every day | ORAL | 3 refills | Status: DC

## 2019-11-21 ENCOUNTER — Other Ambulatory Visit: Payer: Self-pay

## 2019-11-21 DIAGNOSIS — F17210 Nicotine dependence, cigarettes, uncomplicated: Secondary | ICD-10-CM

## 2019-12-01 MED ORDER — VARENICLINE TARTRATE 1 MG PO TABS: 1.0000 mg | ORAL_TABLET | ORAL | 2 refills | Status: DC

## 2019-12-05 ENCOUNTER — Telehealth: Payer: Self-pay

## 2019-12-05 NOTE — Telephone Encounter (Signed)
Confirmed appointment with patient and screened for covid. klh 

## 2019-12-07 ENCOUNTER — Telehealth: Payer: Self-pay

## 2019-12-07 NOTE — Telephone Encounter (Signed)
Patient rescheduled appointment on 12/09/2019 to 12/13/2019. klh

## 2019-12-09 ENCOUNTER — Encounter: Payer: Self-pay | Admitting: Adult Health

## 2019-12-09 ENCOUNTER — Telehealth: Payer: Self-pay

## 2019-12-09 NOTE — Telephone Encounter (Signed)
Called lmom informing patient of appointment. klh 

## 2019-12-12 ENCOUNTER — Other Ambulatory Visit: Payer: Self-pay

## 2019-12-12 DIAGNOSIS — E782 Mixed hyperlipidemia: Secondary | ICD-10-CM

## 2019-12-12 MED ORDER — EZETIMIBE 10 MG PO TABS: 10.0000 mg | ORAL_TABLET | Freq: Every day | ORAL | 5 refills | Status: DC

## 2019-12-13 ENCOUNTER — Other Ambulatory Visit: Payer: Self-pay

## 2019-12-13 ENCOUNTER — Encounter: Payer: Self-pay | Admitting: Adult Health

## 2019-12-13 ENCOUNTER — Ambulatory Visit (INDEPENDENT_AMBULATORY_CARE_PROVIDER_SITE_OTHER): Payer: Federal, State, Local not specified - PPO | Admitting: Adult Health

## 2019-12-13 VITALS — BP 143/65 | HR 76 | Temp 97.1°F | Resp 16 | Ht 69.0 in | Wt 255.0 lb

## 2019-12-13 DIAGNOSIS — Z0001 Encounter for general adult medical examination with abnormal findings: Secondary | ICD-10-CM

## 2019-12-13 DIAGNOSIS — F430 Acute stress reaction: Secondary | ICD-10-CM

## 2019-12-13 DIAGNOSIS — R0602 Shortness of breath: Secondary | ICD-10-CM

## 2019-12-13 DIAGNOSIS — I10 Essential (primary) hypertension: Secondary | ICD-10-CM

## 2019-12-13 DIAGNOSIS — K219 Gastro-esophageal reflux disease without esophagitis: Secondary | ICD-10-CM

## 2019-12-13 DIAGNOSIS — R5383 Other fatigue: Secondary | ICD-10-CM

## 2019-12-13 DIAGNOSIS — F411 Generalized anxiety disorder: Secondary | ICD-10-CM | POA: Diagnosis not present

## 2019-12-13 DIAGNOSIS — E782 Mixed hyperlipidemia: Secondary | ICD-10-CM

## 2019-12-13 DIAGNOSIS — R300 Dysuria: Secondary | ICD-10-CM | POA: Diagnosis not present

## 2019-12-13 NOTE — Progress Notes (Signed)
[facility] [place], [place] [address]  Internal MEDICINE  Office Visit Note  Patient Name: Breanna Flores  [id number]  [id number]  Date of Service: 12/13/2019  Chief Complaint  Patient presents with  . Annual Exam  . Hyperlipidemia  . Hypertension     HPI Pt is here for routine health maintenance examination. Her main concern today is that she is under severe amounts of stress from her job. It has gotten so bad over the last several months that she is planning for early retirement at the end of March. She reports she is not sleeping well at night, averaging 3 hours per night, exhaustion every day of the week, severe hair loss that is requiring a plumber to unclog her drains weekly, loss of appetite and severe heart burn. She has also noticed an increase in her weight, as she feels as though she is stress eating while working from home. She is feeling an immense amount of pressure form her supervisor and feels as though this is the root cause of her stress. We discussed in length the options as far as medications that may help with her stress and anxiety and well as medications for GERD. At this time she is not interested in any medications for her stress and anxiety as she feels she would be more anxious taking a medication that may cloud her judgement at work. She is focused on trying to make it through the next 2 months when she is able to retire. Medications for GERD were also offered to her and she declined as she has tried many prescription medications for this in the past and she never saw any relief in her symptoms. She is using OTC Tums to manage her symptoms at this time.  Blood pressure is well controlled today. Denies chest pain or palpitations. She does report that she has noticed swelling in her legs, mainly bilateral ankles and has noticed mild shortness of breath with exertion. Does not have an echocardiogram on file, does have a history of a heart  murmur.  Lab script given to patient to have lab work done for routine health maintenance. Prefers to go to the hospital to have labs drawn over an outpatient draw station. Is still not comfortable at this time going to get her mammogram due to the COVID 19 pandemic.  Current Medication: Outpatient Encounter Medications as of 12/13/2019  Medication Sig  . albuterol (VENTOLIN HFA) 108 (90 Base) MCG/ACT inhaler Inhale 2 puffs into the lungs every 6 (six) hours as needed for wheezing or shortness of breath.  Marland Kitchen amLODipine (NORVASC) 10 MG tablet Take 1 tablet (10 mg total) by mouth daily.  . budesonide-formoterol (SYMBICORT) 160-4.5 MCG/ACT inhaler Inhale 2 puffs into the lungs 2 (two) times daily.  Marland Kitchen ezetimibe (ZETIA) 10 MG tablet Take 1 tablet (10 mg total) by mouth daily.  . furosemide (LASIX) 20 MG tablet Take 1 tablet (20 mg total) by mouth daily as needed.  Marland Kitchen losartan-hydrochlorothiazide (HYZAAR) 100-25 MG tablet Take 1 tablet by mouth daily.  . Metoprolol Succinate 100 MG CS24 Take 100 mg by mouth daily.  . NON FORMULARY cpap device  . simvastatin (ZOCOR) 20 MG tablet Take 1 tablet (20 mg total) by mouth daily.  . varenicline (CHANTIX CONTINUING MONTH PAK) 1 MG tablet Take 1 tablet (1 mg total) by mouth See admin instructions.   No facility-administered encounter medications on file as of 12/13/2019.    Surgical History: Past Surgical History:  Procedure  Laterality Date  . [place] History: Past Medical History:  Diagnosis Date  . Hyperlipidemia   . Hypertension   . Sleep apnea     Family History: Family History  Problem Relation Age of Onset  . Dementia Mother   . Heart murmur Mother   . Hypertension Father   . Gout Father   . Hyperlipidemia Father       Review of Systems  Constitutional: Positive for fatigue. Negative for chills and unexpected weight change.  HENT: Negative for congestion, rhinorrhea, sneezing and sore throat.   Eyes:  Negative for photophobia, pain and redness.  Respiratory: Positive for shortness of breath. Negative for cough and chest tightness.        Reports increased shortness of breath during times of stress  Cardiovascular: Positive for leg swelling. Negative for chest pain and palpitations.  Gastrointestinal: Negative for abdominal pain, constipation, diarrhea, nausea and vomiting.  Endocrine: Negative.   Genitourinary: Negative for dysuria and frequency.  Musculoskeletal: Negative for arthralgias, back pain, joint swelling and neck pain.  Skin: Negative for rash.  Allergic/Immunologic: Negative.   Neurological: Negative for tremors and numbness.  Hematological: Negative for adenopathy. Does not bruise/bleed easily.  Psychiatric/Behavioral: Positive for sleep disturbance. Negative for behavioral problems. The patient is nervous/anxious.     Vital Signs: BP (!) 143/65   Pulse 76   Temp (!) 97.1 F (36.2 C)   Resp 16   Ht 5\' 9"  (1.753 m)   Wt 255 lb (115.7 kg)   SpO2 99%   BMI 37.66 kg/m    Physical Exam Vitals and nursing note reviewed.  Constitutional:      General: She is not in acute distress.    Appearance: She is well-developed. She is not diaphoretic.  HENT:     Head: Normocephalic and atraumatic.     Mouth/Throat:     Pharynx: No oropharyngeal exudate.  Eyes:     Pupils: Pupils are equal, round, and reactive to light.  Neck:     Thyroid: No thyromegaly.     Vascular: No JVD.     Trachea: No tracheal deviation.  Cardiovascular:     Rate and Rhythm: Normal rate and regular rhythm.     Heart sounds: Normal heart sounds. No murmur. No friction rub. No gallop.   Pulmonary:     Effort: Pulmonary effort is normal. No respiratory distress.     Breath sounds: Normal breath sounds. No wheezing or rales.  Chest:     Chest wall: No tenderness.     Comments: Breast exam performed, normal breast tissue noted Abdominal:     Palpations: Abdomen is soft.     Tenderness: There is  no abdominal tenderness. There is no guarding.  Musculoskeletal:        General: Normal range of motion.     Cervical back: Normal range of motion and neck supple.  Lymphadenopathy:     Cervical: No cervical adenopathy.  Skin:    General: Skin is warm and dry.  Neurological:     Mental Status: She is alert and oriented to person, place, and time.     Cranial Nerves: No cranial nerve deficit.  Psychiatric:        Behavior: Behavior normal.        Thought Content: Thought content normal.        Judgment: Judgment normal.     LABS: No results found for this or any previous visit (from  the past 2160 hour(s)).   Assessment/Plan: 1. Encounter for general adult medical examination with abnormal findings Requests to continue to postpone mammogram at this time until able to receive COVID19 vaccines. Up to date on all other PHM.  2. Anxiety as acute reaction to exceptional stress Stress related to work negatively affecting her emotion state at this time. Would not like to start on any medications at this time as she does not want her cognition to be impaired while trying to work. Is planning for early retirement at the end of March.  3. Mixed hyperlipidemia Lab slip given to have lipid panel reviewed, will follow-up. Continue with current therapy at this time.  4. Gastroesophageal reflux disease without esophagitis Complaints of GERD, related to underlying stress from job. Has been on medications in the past that did not relieve her symptoms. Would like to continue taking OTC Tums to manage symptoms at this time, will continue to monitor.  5. Shortness of breath With complaints of bilateral lower extremity edema and increased episodes of shortness of breath, Echocardiogram ordered to rule out cardiac etiology. No echocardiogram on file from previous exam. - ECHOCARDIOGRAM COMPLETE; Future  6. Other fatigue Lab slip provided to patient for Vitamin D, B12 and to check anemia panel.  7.  Dysuria - UA/M w/rflx Culture, Routine  8. Essential hypertension Stable on current therapy, continue to monitor.  General Counseling: Donyelle verbalizes understanding of the findings of todays visit and agrees with plan of treatment. I have discussed any further diagnostic evaluation that may be needed or ordered today. We also reviewed her medications today. she has been encouraged to call the office with any questions or concerns that should arise related to todays visit.   Orders Placed This Encounter  Procedures  . UA/M w/rflx Culture, Routine    No orders of the defined types were placed in this encounter.   Time spent: 30 Minutes   This patient was seen by Orson Gear AGNP-C in Collaboration with Dr Lavera Guise as a part of collaborative care agreement    Kendell Bane AGNP-C Internal Medicine

## 2019-12-14 LAB — UA/M W/RFLX CULTURE, ROUTINE
Bilirubin, UA: NEGATIVE
Glucose, UA: NEGATIVE
Ketones, UA: NEGATIVE
Leukocytes,UA: NEGATIVE
Nitrite, UA: NEGATIVE
Protein,UA: NEGATIVE
RBC, UA: NEGATIVE
Specific Gravity, UA: 1.009 (ref 1.005–1.030)
Urobilinogen, Ur: 0.2 mg/dL (ref 0.2–1.0)
pH, UA: 6.5 (ref 5.0–7.5)

## 2019-12-14 LAB — MICROSCOPIC EXAMINATION
Bacteria, UA: NONE SEEN
WBC, UA: NONE SEEN /hpf (ref 0–5)

## 2019-12-15 ENCOUNTER — Encounter: Payer: Self-pay | Admitting: Nurse Practitioner

## 2019-12-23 ENCOUNTER — Ambulatory Visit: Payer: Federal, State, Local not specified - PPO

## 2019-12-23 ENCOUNTER — Other Ambulatory Visit: Payer: Self-pay

## 2019-12-23 DIAGNOSIS — R0602 Shortness of breath: Secondary | ICD-10-CM

## 2019-12-27 ENCOUNTER — Telehealth: Payer: Self-pay

## 2019-12-27 NOTE — Telephone Encounter (Signed)
Called confirmed appointment on 12/29/2019 and screened for covid. klh 

## 2019-12-29 ENCOUNTER — Ambulatory Visit (INDEPENDENT_AMBULATORY_CARE_PROVIDER_SITE_OTHER): Payer: Federal, State, Local not specified - PPO | Admitting: Internal Medicine

## 2019-12-29 ENCOUNTER — Other Ambulatory Visit: Payer: Self-pay

## 2019-12-29 ENCOUNTER — Encounter: Payer: Self-pay | Admitting: Internal Medicine

## 2019-12-29 VITALS — Ht 69.0 in | Wt 250.0 lb

## 2019-12-29 DIAGNOSIS — G4733 Obstructive sleep apnea (adult) (pediatric): Secondary | ICD-10-CM | POA: Diagnosis not present

## 2019-12-29 DIAGNOSIS — J449 Chronic obstructive pulmonary disease, unspecified: Secondary | ICD-10-CM | POA: Diagnosis not present

## 2019-12-29 DIAGNOSIS — I340 Nonrheumatic mitral (valve) insufficiency: Secondary | ICD-10-CM

## 2019-12-29 DIAGNOSIS — Z9989 Dependence on other enabling machines and devices: Secondary | ICD-10-CM

## 2019-12-29 DIAGNOSIS — J4489 Other specified chronic obstructive pulmonary disease: Secondary | ICD-10-CM

## 2019-12-29 NOTE — Progress Notes (Signed)
[facility] [place], [place] [address]  Internal MEDICINE  Telephone Visit  Patient Name: Breanna Flores  [id number]  [id number]  Date of Service: 12/29/2019  I connected with the patient at 1229 by telephone and verified the patients identity using two identifiers.   I discussed the limitations, risks, security and privacy concerns of performing an evaluation and management service by telephone and the availability of in person appointments. I also discussed with the patient that there may be a patient responsible charge related to the service.  The patient expressed understanding and agrees to proceed.    Chief Complaint  Patient presents with  . Telephone Screen  . Telephone Assessment  . Follow-up    echo  . Sleep Apnea    HPI PT seen today via video.  She is following up on echo from 2/17.  The echo shows normal LVEF, diastolic dysfunction and trace MR. She denies any current issues at this time..  She has not been hospitalized recently, and overall has been doing well.  She continues to use cpap for osa. Pt reports good compliance with CPAP therapy. Cleaning machine by hand, and changing filters and tubing as directed. Denies headaches, sinus issues, palpitations, or hemoptysis.    Patients Anxiety with work and stress are very high.  She is suppose to retire on march 24th and she is worried she will not make it to retirement, because she is so stressed.   Current Medication: Outpatient Encounter Medications as of 12/29/2019  Medication Sig  . albuterol (VENTOLIN HFA) 108 (90 Base) MCG/ACT inhaler Inhale 2 puffs into the lungs every 6 (six) hours as needed for wheezing or shortness of breath.  Marland Kitchen amLODipine (NORVASC) 10 MG tablet Take 1 tablet (10 mg total) by mouth daily.  . budesonide-formoterol (SYMBICORT) 160-4.5 MCG/ACT inhaler Inhale 2 puffs into the lungs 2 (two) times daily.  Marland Kitchen ezetimibe (ZETIA) 10 MG tablet Take 1 tablet (10 mg total) by mouth  daily.  . furosemide (LASIX) 20 MG tablet Take 1 tablet (20 mg total) by mouth daily as needed.  Marland Kitchen losartan-hydrochlorothiazide (HYZAAR) 100-25 MG tablet Take 1 tablet by mouth daily.  . Metoprolol Succinate 100 MG CS24 Take 100 mg by mouth daily.  . NON FORMULARY cpap device  . simvastatin (ZOCOR) 20 MG tablet Take 1 tablet (20 mg total) by mouth daily.  . varenicline (CHANTIX CONTINUING MONTH PAK) 1 MG tablet Take 1 tablet (1 mg total) by mouth See admin instructions.   No facility-administered encounter medications on file as of 12/29/2019.    Surgical History: Past Surgical History:  Procedure Laterality Date  . [place] History: Past Medical History:  Diagnosis Date  . Hyperlipidemia   . Hypertension   . Sleep apnea     Family History: Family History  Problem Relation Age of Onset  . Dementia Mother   . Heart murmur Mother   . Hypertension Father   . Gout Father   . Hyperlipidemia Father     Social History   Socioeconomic History  . Marital status: Married    Spouse name: Not on file  . Number of children: Not on file  . Years of education: Not on file  . Highest education level: Not on file  Occupational History  . Not on file  Tobacco Use  . Smoking status: Former Smoker    Quit date: 2016    Years since quitting: 5.1  . Smokeless  tobacco: Never Used  Substance and Sexual Activity  . Alcohol use: Yes    Comment: rarely  . Drug use: Never  . Sexual activity: Not on file  Other Topics Concern  . Not on file  Social History Narrative  . Not on file   Social Determinants of Health   Financial Resource Strain:   . Difficulty of Paying Living Expenses: Not on file  Food Insecurity:   . Worried About Charity fundraiser in the Last Year: Not on file  . Ran Out of Food in the Last Year: Not on file  Transportation Needs:   . Lack of Transportation (Medical): Not on file  . Lack of Transportation (Non-Medical): Not on file   Physical Activity:   . Days of Exercise per Week: Not on file  . Minutes of Exercise per Session: Not on file  Stress:   . Feeling of Stress : Not on file  Social Connections:   . Frequency of Communication with Friends and Family: Not on file  . Frequency of Social Gatherings with Friends and Family: Not on file  . Attends Religious Services: Not on file  . Active Member of Clubs or Organizations: Not on file  . Attends Archivist Meetings: Not on file  . Marital Status: Not on file  Intimate Partner Violence:   . Fear of Current or Ex-Partner: Not on file  . Emotionally Abused: Not on file  . Physically Abused: Not on file  . Sexually Abused: Not on file      Review of Systems  Constitutional: Negative for chills, fatigue and unexpected weight change.  HENT: Negative for congestion, rhinorrhea, sneezing and sore throat.   Eyes: Negative for photophobia, pain and redness.  Respiratory: Negative for cough, chest tightness and shortness of breath.   Cardiovascular: Negative for chest pain and palpitations.  Gastrointestinal: Negative for abdominal pain, constipation, diarrhea, nausea and vomiting.  Endocrine: Negative.   Genitourinary: Negative for dysuria and frequency.  Musculoskeletal: Negative for arthralgias, back pain, joint swelling and neck pain.  Skin: Negative for rash.  Allergic/Immunologic: Negative.   Neurological: Negative for tremors and numbness.  Hematological: Negative for adenopathy. Does not bruise/bleed easily.  Psychiatric/Behavioral: Negative for behavioral problems and sleep disturbance. The patient is not nervous/anxious.     Vital Signs: Ht 5\' 9"  (1.753 m)   Wt 250 lb (113.4 kg)   BMI 36.92 kg/m    Observation/Objective:  Well appearing, NAD noted.    Assessment/Plan: 1. OSA on CPAP Continue to use cpap nightly as directed.  2. Obstructive chronic bronchitis without exacerbation ([place]) Stable, continue present management  Continue to use symbicort as ventolin as directed.  3. Nonrheumatic mitral valve regurgitation Continue to see cardiology as directed.   General Counseling: Breanna Flores verbalizes understanding of the findings of today's phone visit and agrees with plan of treatment. I have discussed any further diagnostic evaluation that may be needed or ordered today. We also reviewed her medications today. she has been encouraged to call the office with any questions or concerns that should arise related to todays visit.    No orders of the defined types were placed in this encounter.   No orders of the defined types were placed in this encounter.   Time spent: [place] [facility] Internal medicine

## 2020-01-09 ENCOUNTER — Telehealth: Payer: Self-pay

## 2020-01-16 ENCOUNTER — Telehealth: Payer: Self-pay

## 2020-01-16 NOTE — Telephone Encounter (Signed)
Called lmom informing patient of appointment on 01/18/2020. klh

## 2020-01-18 ENCOUNTER — Encounter: Payer: Self-pay | Admitting: Adult Health

## 2020-01-18 ENCOUNTER — Other Ambulatory Visit: Payer: Self-pay

## 2020-01-18 ENCOUNTER — Ambulatory Visit: Payer: Federal, State, Local not specified - PPO | Admitting: Adult Health

## 2020-01-18 VITALS — BP 113/48 | HR 63 | Temp 97.4°F | Resp 16 | Ht 69.0 in | Wt 257.6 lb

## 2020-01-18 DIAGNOSIS — F411 Generalized anxiety disorder: Secondary | ICD-10-CM | POA: Diagnosis not present

## 2020-01-18 DIAGNOSIS — Z6838 Body mass index (BMI) 38.0-38.9, adult: Secondary | ICD-10-CM

## 2020-01-18 DIAGNOSIS — Z23 Encounter for immunization: Secondary | ICD-10-CM

## 2020-01-18 DIAGNOSIS — F17210 Nicotine dependence, cigarettes, uncomplicated: Secondary | ICD-10-CM

## 2020-01-18 DIAGNOSIS — E782 Mixed hyperlipidemia: Secondary | ICD-10-CM

## 2020-01-18 DIAGNOSIS — I10 Essential (primary) hypertension: Secondary | ICD-10-CM | POA: Diagnosis not present

## 2020-01-18 DIAGNOSIS — F430 Acute stress reaction: Secondary | ICD-10-CM

## 2020-01-18 DIAGNOSIS — Z9989 Dependence on other enabling machines and devices: Secondary | ICD-10-CM

## 2020-01-18 DIAGNOSIS — G4733 Obstructive sleep apnea (adult) (pediatric): Secondary | ICD-10-CM

## 2020-01-18 DIAGNOSIS — K219 Gastro-esophageal reflux disease without esophagitis: Secondary | ICD-10-CM

## 2020-01-18 NOTE — Progress Notes (Signed)
[facility] [place], [place] [address]  Internal MEDICINE  Office Visit Note  Patient Name: Breanna Flores  [id number]  [id number]  Date of Service: 01/18/2020  Chief Complaint  Patient presents with  . Sleep Apnea  . Hyperlipidemia  . Hypertension    HPI  Pt is here for follow up on Anxiety, HTN, HLD and anxiety.  Pt is currently out of work for severe stress anxiety.  She is planning to retire on 02/01/20 and has done her paperwork to do that.  She appears better at today's visit.  She is calm and cooperative.  Her blood pressure is lower today.  She Denies Chest pain, Shortness of breath, palpitations, headache, or blurred vision. She has a history remarkable for OSA, as well.  She is using her cpap as prescribed.     Current Medication: Outpatient Encounter Medications as of 01/18/2020  Medication Sig  . albuterol (VENTOLIN HFA) 108 (90 Base) MCG/ACT inhaler Inhale 2 puffs into the lungs every 6 (six) hours as needed for wheezing or shortness of breath.  Marland Kitchen amLODipine (NORVASC) 10 MG tablet Take 1 tablet (10 mg total) by mouth daily.  . budesonide-formoterol (SYMBICORT) 160-4.5 MCG/ACT inhaler Inhale 2 puffs into the lungs 2 (two) times daily.  Marland Kitchen ezetimibe (ZETIA) 10 MG tablet Take 1 tablet (10 mg total) by mouth daily.  . furosemide (LASIX) 20 MG tablet Take 1 tablet (20 mg total) by mouth daily as needed.  Marland Kitchen losartan-hydrochlorothiazide (HYZAAR) 100-25 MG tablet Take 1 tablet by mouth daily.  . Metoprolol Succinate 100 MG CS24 Take 100 mg by mouth daily.  . NON FORMULARY cpap device  . simvastatin (ZOCOR) 20 MG tablet Take 1 tablet (20 mg total) by mouth daily.  . varenicline (CHANTIX CONTINUING MONTH PAK) 1 MG tablet Take 1 tablet (1 mg total) by mouth See admin instructions.   No facility-administered encounter medications on file as of 01/18/2020.    Surgical History: Past Surgical History:  Procedure Laterality Date  . [place] History: Past Medical History:  Diagnosis Date  . Hyperlipidemia   . Hypertension   . Sleep apnea     Family History: Family History  Problem Relation Age of Onset  . Dementia Mother   . Heart murmur Mother   . Hypertension Father   . Gout Father   . Hyperlipidemia Father     Social History   Socioeconomic History  . Marital status: Married    Spouse name: Not on file  . Number of children: Not on file  . Years of education: Not on file  . Highest education level: Not on file  Occupational History  . Not on file  Tobacco Use  . Smoking status: Former Smoker    Quit date: 2016    Years since quitting: 5.1  . Smokeless tobacco: Never Used  Substance and Sexual Activity  . Alcohol use: Yes    Comment: rarely  . Drug use: Never  . Sexual activity: Not on file  Other Topics Concern  . Not on file  Social History Narrative  . Not on file   Social Determinants of Health   Financial Resource Strain:   . Difficulty of Paying Living Expenses: Not on file  Food Insecurity:   . Worried About Charity fundraiser in the Last Year: Not on file  . Ran Out of Food in the Last Year: Not on file  Transportation Needs:   .  Lack of Transportation (Medical): Not on file  . Lack of Transportation (Non-Medical): Not on file  Physical Activity:   . Days of Exercise per Week: Not on file  . Minutes of Exercise per Session: Not on file  Stress:   . Feeling of Stress : Not on file  Social Connections:   . Frequency of Communication with Friends and Family: Not on file  . Frequency of Social Gatherings with Friends and Family: Not on file  . Attends Religious Services: Not on file  . Active Member of Clubs or Organizations: Not on file  . Attends Archivist Meetings: Not on file  . Marital Status: Not on file  Intimate Partner Violence:   . Fear of Current or Ex-Partner: Not on file  . Emotionally Abused: Not on file  . Physically Abused: Not on file  .  Sexually Abused: Not on file      Review of Systems  Constitutional: Negative for chills, fatigue and unexpected weight change.  HENT: Negative for congestion, rhinorrhea, sneezing and sore throat.   Eyes: Negative for photophobia, pain and redness.  Respiratory: Negative for cough, chest tightness and shortness of breath.   Cardiovascular: Negative for chest pain and palpitations.  Gastrointestinal: Negative for abdominal pain, constipation, diarrhea, nausea and vomiting.  Endocrine: Negative.   Genitourinary: Negative for dysuria and frequency.  Musculoskeletal: Negative for arthralgias, back pain, joint swelling and neck pain.  Skin: Negative for rash.  Allergic/Immunologic: Negative.   Neurological: Negative for tremors and numbness.  Hematological: Negative for adenopathy. Does not bruise/bleed easily.  Psychiatric/Behavioral: Negative for behavioral problems and sleep disturbance. The patient is not nervous/anxious.     Vital Signs: BP (!) 113/48   Pulse 63   Temp (!) 97.4 F (36.3 C)   Resp 16   Ht 5\' 9"  (1.753 m)   Wt 257 lb 9.6 oz (116.8 kg)   SpO2 96%   BMI 38.04 kg/m    Physical Exam Vitals and nursing note reviewed.  Constitutional:      General: She is not in acute distress.    Appearance: She is well-developed. She is not diaphoretic.  HENT:     Head: Normocephalic and atraumatic.     Mouth/Throat:     Pharynx: No oropharyngeal exudate.  Eyes:     Pupils: Pupils are equal, round, and reactive to light.  Neck:     Thyroid: No thyromegaly.     Vascular: No JVD.     Trachea: No tracheal deviation.  Cardiovascular:     Rate and Rhythm: Normal rate and regular rhythm.     Heart sounds: Normal heart sounds. No murmur. No friction rub. No gallop.   Pulmonary:     Effort: Pulmonary effort is normal. No respiratory distress.     Breath sounds: Normal breath sounds. No wheezing or rales.  Chest:     Chest wall: No tenderness.  Abdominal:     Palpations:  Abdomen is soft.     Tenderness: There is no abdominal tenderness. There is no guarding.  Musculoskeletal:        General: Normal range of motion.     Cervical back: Normal range of motion and neck supple.  Lymphadenopathy:     Cervical: No cervical adenopathy.  Skin:    General: Skin is warm and dry.  Neurological:     Mental Status: She is alert and oriented to person, place, and time.     Cranial Nerves: No cranial nerve deficit.  Psychiatric:        Behavior: Behavior normal.        Thought Content: Thought content normal.        Judgment: Judgment normal.    Assessment/Plan: 1. Anxiety as acute reaction to exceptional stress PT is current out of work, appears to have more control of symptoms at this time. She is very thankful, and is looking forward to her retirement in 2 weeks. Continue to monitor.  2. Essential hypertension No HTN today, continue as before.   3. Mixed hyperlipidemia Stable, continue to follow.  4. Gastroesophageal reflux disease without esophagitis Controlled on zocor, continue to monitor  5. Cigarette smoker Smoking cessation counseling: 1. Pt acknowledges the risks of long term smoking, she will try to quite smoking. 2. Options for different medications including nicotine products, chewing gum, patch etc, Wellbutrin and Chantix is discussed 3. Goal and date of compete cessation is discussed 4. Total time spent in smoking cessation is 15 min.  6. OSA on CPAP Good relief of symptoms, continue to wear cpap as prescribed.   7. BMI 38.0-38.9,adult Comorbidities include GERD, HLD, HTN.   8. Need for vaccination against Streptococcus pneumoniae using pneumococcal conjugate vaccine 13 - Pneumococcal conjugate vaccine 13-valent  General Counseling: Sheryal verbalizes understanding of the findings of todays visit and agrees with plan of treatment. I have discussed any further diagnostic evaluation that may be needed or ordered today. We also reviewed her  medications today. she has been encouraged to call the office with any questions or concerns that should arise related to todays visit.    No orders of the defined types were placed in this encounter.   No orders of the defined types were placed in this encounter.   Time spent: 25 Minutes   This patient was seen by Orson Gear AGNP-C in Collaboration with Dr Lavera Guise as a part of collaborative care agreement     Kendell Bane AGNP-C Internal medicine

## 2020-03-08 ENCOUNTER — Other Ambulatory Visit: Payer: Self-pay

## 2020-03-08 DIAGNOSIS — I10 Essential (primary) hypertension: Secondary | ICD-10-CM

## 2020-03-08 MED ORDER — AMLODIPINE BESYLATE 10 MG PO TABS: 10.0000 mg | ORAL_TABLET | Freq: Every day | ORAL | 6 refills | Status: DC

## 2020-03-19 ENCOUNTER — Other Ambulatory Visit: Payer: Self-pay

## 2020-03-19 DIAGNOSIS — I10 Essential (primary) hypertension: Secondary | ICD-10-CM

## 2020-03-19 MED ORDER — FUROSEMIDE 20 MG PO TABS: 20.0000 mg | ORAL_TABLET | Freq: Every day | ORAL | 5 refills | Status: DC | PRN

## 2020-03-22 ENCOUNTER — Other Ambulatory Visit: Payer: Self-pay

## 2020-03-22 DIAGNOSIS — I10 Essential (primary) hypertension: Secondary | ICD-10-CM

## 2020-03-22 MED ORDER — METOPROLOL SUCCINATE 100 MG PO CS24: 100.0000 mg | EXTENDED_RELEASE_CAPSULE | Freq: Every day | ORAL | 3 refills | Status: DC

## 2020-04-17 ENCOUNTER — Telehealth: Payer: Self-pay

## 2020-04-17 NOTE — Telephone Encounter (Signed)
Confirmed appointment on 04/19/2020 and screened for covid. klh

## 2020-04-19 ENCOUNTER — Other Ambulatory Visit: Payer: Self-pay

## 2020-04-19 ENCOUNTER — Ambulatory Visit (INDEPENDENT_AMBULATORY_CARE_PROVIDER_SITE_OTHER): Payer: Federal, State, Local not specified - PPO | Admitting: Adult Health

## 2020-04-19 ENCOUNTER — Encounter: Payer: Self-pay | Admitting: Adult Health

## 2020-04-19 ENCOUNTER — Other Ambulatory Visit
Admission: RE | Admit: 2020-04-19 | Discharge: 2020-04-19 | Disposition: A | Discharge status: Home / Self Care | Payer: Federal, State, Local not specified - PPO | Attending: Adult Health | Admitting: Adult Health

## 2020-04-19 VITALS — BP 129/64 | HR 65 | Temp 97.3°F | Resp 16 | Ht 69.0 in | Wt 245.6 lb

## 2020-04-19 DIAGNOSIS — E782 Mixed hyperlipidemia: Secondary | ICD-10-CM

## 2020-04-19 DIAGNOSIS — Z79899 Other long term (current) drug therapy: Secondary | ICD-10-CM | POA: Diagnosis not present

## 2020-04-19 DIAGNOSIS — I10 Essential (primary) hypertension: Secondary | ICD-10-CM | POA: Diagnosis not present

## 2020-04-19 DIAGNOSIS — R5383 Other fatigue: Secondary | ICD-10-CM

## 2020-04-19 DIAGNOSIS — F411 Generalized anxiety disorder: Secondary | ICD-10-CM

## 2020-04-19 DIAGNOSIS — F430 Acute stress reaction: Secondary | ICD-10-CM

## 2020-04-19 DIAGNOSIS — Z9989 Dependence on other enabling machines and devices: Secondary | ICD-10-CM

## 2020-04-19 DIAGNOSIS — G4733 Obstructive sleep apnea (adult) (pediatric): Secondary | ICD-10-CM

## 2020-04-19 DIAGNOSIS — K219 Gastro-esophageal reflux disease without esophagitis: Secondary | ICD-10-CM

## 2020-04-19 LAB — COMPREHENSIVE METABOLIC PANEL
ALT: 22 U/L (ref 0–44)
AST: 28 U/L (ref 15–41)
Albumin: 4.1 g/dL (ref 3.5–5.0)
Alkaline Phosphatase: 52 U/L (ref 38–126)
Anion gap: 11 (ref 5–15)
BUN: 13 mg/dL (ref 8–23)
CO2: 28 mmol/L (ref 22–32)
Calcium: 9.8 mg/dL (ref 8.9–10.3)
Chloride: 100 mmol/L (ref 98–111)
Creatinine, Ser: 0.89 mg/dL (ref 0.44–1.00)
GFR calc Af Amer: >60 mL/min (ref >60)
GFR calc non Af Amer: >60 mL/min (ref >60)
Glucose, Bld: 130 mg/dL — ABNORMAL HIGH (ref 70–99)
Potassium: 3.5 mmol/L (ref 3.5–5.1)
Sodium: 139 mmol/L (ref 135–145)
Total Bilirubin: 0.7 mg/dL (ref 0.3–1.2)
Total Protein: 7.8 g/dL (ref 6.5–8.1)

## 2020-04-19 LAB — CBC WITH DIFFERENTIAL/PLATELET
Abs Immature Granulocytes: 0.05 10*3/uL (ref 0.00–0.07)
Basophils Absolute: 0.1 10*3/uL (ref 0.0–0.1)
Basophils Relative: 1 %
Eosinophils Absolute: 0.3 10*3/uL (ref 0.0–0.5)
Eosinophils Relative: 2 %
HCT: 46.5 % — ABNORMAL HIGH (ref 36.0–46.0)
Hemoglobin: 15.6 g/dL — ABNORMAL HIGH (ref 12.0–15.0)
Immature Granulocytes: 1 %
Lymphocytes Relative: 25 %
Lymphs Abs: 2.6 10*3/uL (ref 0.7–4.0)
MCH: 29.8 pg (ref 26.0–34.0)
MCHC: 33.5 g/dL (ref 30.0–36.0)
MCV: 88.7 fL (ref 80.0–100.0)
Monocytes Absolute: 0.5 10*3/uL (ref 0.1–1.0)
Monocytes Relative: 5 %
Neutro Abs: 7 10*3/uL (ref 1.7–7.7)
Neutrophils Relative %: 66 %
Platelets: 211 10*3/uL (ref 150–400)
RBC: 5.24 MIL/uL — ABNORMAL HIGH (ref 3.87–5.11)
RDW: 13 % (ref 11.5–15.5)
WBC: 10.5 10*3/uL (ref 4.0–10.5)
nRBC: 0 % (ref 0.0–0.2)

## 2020-04-19 LAB — IRON AND TIBC
Iron: 81 ug/dL (ref 28–170)
Saturation Ratios: 23 % (ref 10.4–31.8)
TIBC: 347 ug/dL (ref 250–450)
UIBC: 266 ug/dL

## 2020-04-19 LAB — T4, FREE: Free T4: 0.95 ng/dL (ref 0.61–1.12)

## 2020-04-19 LAB — FERRITIN: Ferritin: 94 ng/mL (ref 11–307)

## 2020-04-19 LAB — LIPID PANEL
Cholesterol: 107 mg/dL (ref 0–200)
HDL: 45 mg/dL (ref >40)
LDL Cholesterol: 33 mg/dL (ref 0–99)
Total CHOL/HDL Ratio: 2.4 RATIO
Triglycerides: 146 mg/dL (ref <150)
VLDL: 29 mg/dL (ref 0–40)

## 2020-04-19 LAB — TSH: TSH: 1.97 u[IU]/mL (ref 0.350–4.500)

## 2020-04-19 LAB — VITAMIN B12: Vitamin B-12: 543 pg/mL (ref 180–914)

## 2020-04-19 LAB — VITAMIN D 25 HYDROXY (VIT D DEFICIENCY, FRACTURES): Vit D, 25-Hydroxy: 22.77 ng/mL — ABNORMAL LOW (ref 30–100)

## 2020-04-19 NOTE — Progress Notes (Signed)
[facility] [place], [place] [address]  Internal MEDICINE  Office Visit Note  Patient Name: Breanna Flores  [id number]  [id number]  Date of Service: 04/19/2020  Chief Complaint  Patient presents with  . Follow-up  . Hyperlipidemia  . Hypertension    HPI  Pt is here for follow up on HTN, anxiety and HLD. She appears at her baseline.  She is doing well since retirement.  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  Her blood pressure is well controlled. She has completed her covid vaccines.    Current Medication: Outpatient Encounter Medications as of 04/19/2020  Medication Sig  . albuterol (VENTOLIN HFA) 108 (90 Base) MCG/ACT inhaler Inhale 2 puffs into the lungs every 6 (six) hours as needed for wheezing or shortness of breath.  Marland Kitchen amLODipine (NORVASC) 10 MG tablet Take 1 tablet (10 mg total) by mouth daily.  . budesonide-formoterol (SYMBICORT) 160-4.5 MCG/ACT inhaler Inhale 2 puffs into the lungs 2 (two) times daily.  Marland Kitchen ezetimibe (ZETIA) 10 MG tablet Take 1 tablet (10 mg total) by mouth daily.  . furosemide (LASIX) 20 MG tablet Take 1 tablet (20 mg total) by mouth daily as needed.  Marland Kitchen losartan-hydrochlorothiazide (HYZAAR) 100-25 MG tablet Take 1 tablet by mouth daily.  . Metoprolol Succinate 100 MG CS24 Take 100 mg by mouth daily.  . NON FORMULARY cpap device  . simvastatin (ZOCOR) 20 MG tablet Take 1 tablet (20 mg total) by mouth daily.  . varenicline (CHANTIX CONTINUING MONTH PAK) 1 MG tablet Take 1 tablet (1 mg total) by mouth See admin instructions.   No facility-administered encounter medications on file as of 04/19/2020.    Surgical History: Past Surgical History:  Procedure Laterality Date  . [place] History: Past Medical History:  Diagnosis Date  . Hyperlipidemia   . Hypertension   . Sleep apnea     Family History: Family History  Problem Relation Age of Onset  .  Dementia Mother   . Heart murmur Mother   . Hypertension Father   . Gout Father   . Hyperlipidemia Father     Social History   Socioeconomic History  . Marital status: Married    Spouse name: Not on file  . Number of children: Not on file  . Years of education: Not on file  . Highest education level: Not on file  Occupational History  . Not on file  Tobacco Use  . Smoking status: Former Smoker    Quit date: 2016    Years since quitting: 5.4  . Smokeless tobacco: Never Used  Substance and Sexual Activity  . Alcohol use: Yes    Comment: rarely  . Drug use: Never  . Sexual activity: Not on file  Other Topics Concern  . Not on file  Social History Narrative  . Not on file   Social Determinants of Health   Financial Resource Strain:   . Difficulty of Paying Living Expenses:   Food Insecurity:   . Worried About Charity fundraiser in the Last Year:   . Arboriculturist in the Last Year:   Transportation Needs:   . Film/video editor (Medical):   Marland Kitchen Lack of Transportation (Non-Medical):   Physical Activity:   . Days of Exercise per Week:   . Minutes of Exercise per Session:   Stress:   . Feeling of Stress :   Social  Connections:   . Frequency of Communication with Friends and Family:   . Frequency of Social Gatherings with Friends and Family:   . Attends Religious Services:   . Active Member of Clubs or Organizations:   . Attends Archivist Meetings:   Marland Kitchen Marital Status:   Intimate Partner Violence:   . Fear of Current or Ex-Partner:   . Emotionally Abused:   Marland Kitchen Physically Abused:   . Sexually Abused:       Review of Systems  Constitutional: Negative for chills, fatigue and unexpected weight change.  HENT: Negative for congestion, rhinorrhea, sneezing and sore throat.   Eyes: Negative for photophobia, pain and redness.  Respiratory: Negative for cough, chest tightness and shortness of breath.   Cardiovascular: Negative for chest pain and  palpitations.  Gastrointestinal: Negative for abdominal pain, constipation, diarrhea, nausea and vomiting.  Endocrine: Negative.   Genitourinary: Negative for dysuria and frequency.  Musculoskeletal: Negative for arthralgias, back pain, joint swelling and neck pain.  Skin: Negative for rash.  Allergic/Immunologic: Negative.   Neurological: Negative for tremors and numbness.  Hematological: Negative for adenopathy. Does not bruise/bleed easily.  Psychiatric/Behavioral: Negative for behavioral problems and sleep disturbance. The patient is not nervous/anxious.     Vital Signs: BP 129/64   Pulse 65   Temp (!) 97.3 F (36.3 C)   Resp 16   Ht 5\' 9"  (1.753 m)   Wt 245 lb 9.6 oz (111.4 kg)   SpO2 99%   BMI 36.27 kg/m    Physical Exam Vitals and nursing note reviewed.  Constitutional:      General: She is not in acute distress.    Appearance: She is well-developed. She is not diaphoretic.  HENT:     Head: Normocephalic and atraumatic.     Mouth/Throat:     Pharynx: No oropharyngeal exudate.  Eyes:     Pupils: Pupils are equal, round, and reactive to light.  Neck:     Thyroid: No thyromegaly.     Vascular: No JVD.     Trachea: No tracheal deviation.  Cardiovascular:     Rate and Rhythm: Normal rate and regular rhythm.     Heart sounds: Normal heart sounds. No murmur heard.  No friction rub. No gallop.   Pulmonary:     Effort: Pulmonary effort is normal. No respiratory distress.     Breath sounds: Normal breath sounds. No wheezing or rales.  Chest:     Chest wall: No tenderness.  Abdominal:     Palpations: Abdomen is soft.     Tenderness: There is no abdominal tenderness. There is no guarding.  Musculoskeletal:        General: Normal range of motion.     Cervical back: Normal range of motion and neck supple.  Lymphadenopathy:     Cervical: No cervical adenopathy.  Skin:    General: Skin is warm and dry.  Neurological:     Mental Status: She is alert and oriented to  person, place, and time.     Cranial Nerves: No cranial nerve deficit.  Psychiatric:        Behavior: Behavior normal.        Thought Content: Thought content normal.        Judgment: Judgment normal.     Assessment/Plan: 1. Essential hypertension Well controlled, continue current management.   2. Anxiety as acute reaction to exceptional stress Much improved since retiring.  Patient appears well.  Continue to follow.   3. Mixed  hyperlipidemia Patient provided with paper order for lipid panel and other labs.   4. Long-term use of high-risk medication Continue to monitor labs.  5. Other fatigue Stable, improved.   6. Gastroesophageal reflux disease without esophagitis No recent issues.  Continue to take medications as discussed.   7. OSA on CPAP Encouraged continued cpap compliance.   General Counseling: Tattiana verbalizes understanding of the findings of todays visit and agrees with plan of treatment. I have discussed any further diagnostic evaluation that may be needed or ordered today. We also reviewed her medications today. she has been encouraged to call the office with any questions or concerns that should arise related to todays visit.    No orders of the defined types were placed in this encounter.   No orders of the defined types were placed in this encounter.   Time spent: 30 Minutes   This patient was seen by Orson Gear AGNP-C in Collaboration with Dr Lavera Guise as a part of collaborative care agreement     Kendell Bane AGNP-C Internal medicine

## 2020-04-19 NOTE — Progress Notes (Signed)
Review with patient at visit 05/07/2020

## 2020-04-20 ENCOUNTER — Other Ambulatory Visit: Payer: Self-pay

## 2020-04-20 ENCOUNTER — Other Ambulatory Visit
Admission: RE | Admit: 2020-04-20 | Discharge: 2020-04-20 | Disposition: A | Discharge status: Home / Self Care | Payer: Federal, State, Local not specified - PPO | Attending: Adult Health | Admitting: Adult Health

## 2020-04-20 DIAGNOSIS — E538 Deficiency of other specified B group vitamins: Secondary | ICD-10-CM | POA: Diagnosis not present

## 2020-04-20 LAB — FOLATE: Folate: 68 ng/mL (ref >5.9)

## 2020-05-03 ENCOUNTER — Telehealth: Payer: Self-pay

## 2020-05-03 NOTE — Telephone Encounter (Signed)
Patient rescheduled appointment on 05/07/2020 to 05/24/2020. klh

## 2020-05-07 ENCOUNTER — Ambulatory Visit: Payer: Federal, State, Local not specified - PPO | Admitting: Internal Medicine

## 2020-05-15 ENCOUNTER — Other Ambulatory Visit: Payer: Self-pay

## 2020-05-15 DIAGNOSIS — I10 Essential (primary) hypertension: Secondary | ICD-10-CM

## 2020-05-15 MED ORDER — LOSARTAN POTASSIUM-HCTZ 100-25 MG PO TABS: 1.0000 | ORAL_TABLET | Freq: Every day | ORAL | 5 refills | Status: DC

## 2020-05-22 ENCOUNTER — Telehealth: Payer: Self-pay

## 2020-05-22 NOTE — Telephone Encounter (Signed)
CONFIRMED PATIENT APPT. -AR °

## 2020-05-24 ENCOUNTER — Other Ambulatory Visit: Payer: Self-pay

## 2020-05-24 ENCOUNTER — Encounter: Payer: Self-pay | Admitting: Internal Medicine

## 2020-05-24 ENCOUNTER — Ambulatory Visit: Payer: Federal, State, Local not specified - PPO | Admitting: Internal Medicine

## 2020-05-24 VITALS — BP 146/70 | HR 67 | Temp 97.3°F | Resp 16 | Ht 69.0 in | Wt 241.6 lb

## 2020-05-24 DIAGNOSIS — J449 Chronic obstructive pulmonary disease, unspecified: Secondary | ICD-10-CM | POA: Diagnosis not present

## 2020-05-24 DIAGNOSIS — G4733 Obstructive sleep apnea (adult) (pediatric): Secondary | ICD-10-CM

## 2020-05-24 DIAGNOSIS — Z6838 Body mass index (BMI) 38.0-38.9, adult: Secondary | ICD-10-CM | POA: Diagnosis not present

## 2020-05-24 NOTE — Patient Instructions (Signed)

## 2020-05-24 NOTE — Progress Notes (Signed)
[facility] [place], [place] [address]  Pulmonary Sleep Medicine   Office Visit Note  Patient Name: Breanna Flores DOB: [birth year] MRN [id number]  Date of Service: 05/24/2020  Complaints/HPI: OSA she is doing well with her CPAP device.  Has had no major issues.  She states that she is compliant has been having good results with using the machine.  Denies having any cough congestion nasal congestion.  No fevers or chills are noted.  She is non-smoking.  She has had no exacerbations of her breathing issues  ROS  General: (-) fever, (-) chills, (-) night sweats, (-) weakness Skin: (-) rashes, (-) itching,. Eyes: (-) visual changes, (-) redness, (-) itching. Nose and Sinuses: (-) nasal stuffiness or itchiness, (-) postnasal drip, (-) nosebleeds, (-) sinus trouble. Mouth and Throat: (-) sore throat, (-) hoarseness. Neck: (-) swollen glands, (-) enlarged thyroid, (-) neck pain. Respiratory: - cough, (-) bloody sputum, - shortness of breath, - wheezing. Cardiovascular: - ankle swelling, (-) chest pain. Lymphatic: (-) lymph node enlargement. Neurologic: (-) numbness, (-) tingling. Psychiatric: (-) anxiety, (-) depression   Current Medication: Outpatient Encounter Medications as of 05/24/2020  Medication Sig  . albuterol (VENTOLIN HFA) 108 (90 Base) MCG/ACT inhaler Inhale 2 puffs into the lungs every 6 (six) hours as needed for wheezing or shortness of breath.  Marland Kitchen amLODipine (NORVASC) 10 MG tablet Take 1 tablet (10 mg total) by mouth daily.  . budesonide-formoterol (SYMBICORT) 160-4.5 MCG/ACT inhaler Inhale 2 puffs into the lungs 2 (two) times daily.  Marland Kitchen ezetimibe (ZETIA) 10 MG tablet Take 1 tablet (10 mg total) by mouth daily.  . furosemide (LASIX) 20 MG tablet Take 1 tablet (20 mg total) by mouth daily as needed.  Marland Kitchen losartan-hydrochlorothiazide (HYZAAR) 100-25 MG tablet Take 1 tablet by mouth daily.  . Metoprolol Succinate 100 MG CS24 Take 100 mg by mouth daily.   . NON FORMULARY cpap device  . simvastatin (ZOCOR) 20 MG tablet Take 1 tablet (20 mg total) by mouth daily.  . varenicline (CHANTIX CONTINUING MONTH PAK) 1 MG tablet Take 1 tablet (1 mg total) by mouth See admin instructions.   No facility-administered encounter medications on file as of 05/24/2020.    Surgical History: Past Surgical History:  Procedure Laterality Date  . [place] History: Past Medical History:  Diagnosis Date  . Hyperlipidemia   . Hypertension   . Sleep apnea     Family History: Family History  Problem Relation Age of Onset  . Dementia Mother   . Heart murmur Mother   . Hypertension Father   . Gout Father   . Hyperlipidemia Father     Social History: Social History   Socioeconomic History  . Marital status: Married    Spouse name: Not on file  . Number of children: Not on file  . Years of education: Not on file  . Highest education level: Not on file  Occupational History  . Not on file  Tobacco Use  . Smoking status: Former Smoker    Quit date: 2016    Years since quitting: 5.5  . Smokeless tobacco: Never Used  Substance and Sexual Activity  . Alcohol use: Yes    Comment: rarely  . Drug use: Never  . Sexual activity: Not on file  Other Topics Concern  . Not on file  Social History Narrative  . Not on file   Social Determinants of Health   Financial Resource Strain:   .  Difficulty of Paying Living Expenses:   Food Insecurity:   . Worried About Charity fundraiser in the Last Year:   . Arboriculturist in the Last Year:   Transportation Needs:   . Film/video editor (Medical):   Marland Kitchen Lack of Transportation (Non-Medical):   Physical Activity:   . Days of Exercise per Week:   . Minutes of Exercise per Session:   Stress:   . Feeling of Stress :   Social Connections:   . Frequency of Communication with Friends and Family:   . Frequency of Social Gatherings with Friends and Family:   . Attends Religious  Services:   . Active Member of Clubs or Organizations:   . Attends Archivist Meetings:   Marland Kitchen Marital Status:   Intimate Partner Violence:   . Fear of Current or Ex-Partner:   . Emotionally Abused:   Marland Kitchen Physically Abused:   . Sexually Abused:     Vital Signs: Blood pressure (!) 146/70, pulse 67, temperature (!) 97.3 F (36.3 C), resp. rate 16, height 5\' 9"  (1.753 m), weight 241 lb 9.6 oz (109.6 kg), SpO2 96 %.  Examination: General Appearance: The patient is well-developed, well-nourished, and in no distress. Skin: Gross inspection of skin unremarkable. Head: normocephalic, no gross deformities. Eyes: no gross deformities noted. ENT: ears appear grossly normal no exudates. Neck: Supple. No thyromegaly. No LAD. Respiratory: few rhonchi noted. . Cardiovascular: Normal S1 and S2 without murmur or rub. Extremities: No cyanosis. pulses are equal. Neurologic: Alert and oriented. No involuntary movements.  LABS: Recent Results (from the past 2160 hour(s))  Comprehensive metabolic panel     Status: Abnormal   Collection Time: 04/19/20 10:18 AM  Result Value Ref Range   Sodium 139 135 - 145 mmol/L   Potassium 3.5 3.5 - 5.1 mmol/L   Chloride 100 98 - 111 mmol/L   CO2 28 22 - 32 mmol/L   Glucose, Bld 130 (H) 70 - 99 mg/dL    Comment: Glucose reference range applies only to samples taken after fasting for at least 8 hours.   BUN 13 8 - 23 mg/dL   Creatinine, Ser 0.89 0.44 - 1.00 mg/dL   Calcium 9.8 8.9 - 10.3 mg/dL   Total Protein 7.8 6.5 - 8.1 g/dL   Albumin 4.1 3.5 - 5.0 g/dL   AST 28 15 - 41 U/L   ALT 22 0 - 44 U/L   Alkaline Phosphatase 52 38 - 126 U/L   Total Bilirubin 0.7 0.3 - 1.2 mg/dL   GFR calc non Af Amer >60 >60 mL/min   GFR calc Af Amer >60 >60 mL/min   Anion gap 11 5 - 15    Comment: Performed at [facility], [address] [place], [place] [address]  Lipid panel     Status: None   Collection Time: 04/19/20 10:18 AM  Result Value Ref Range    Cholesterol 107 0 - 200 mg/dL   Triglycerides 146 <150 mg/dL   HDL 45 >40 mg/dL   Total CHOL/HDL Ratio 2.4 RATIO   VLDL 29 0 - 40 mg/dL   LDL Cholesterol 33 0 - 99 mg/dL    Comment:        Total Cholesterol/HDL:CHD Risk Coronary Heart Disease Risk Table                     Men   Women  1/2 Average Risk   3.4   3.3  Average  Risk       5.0   4.4  2 X Average Risk   9.6   7.1  3 X Average Risk  23.4   11.0        Use the calculated Patient Ratio above and the CHD Risk Table to determine the patient's CHD Risk.        ATP III CLASSIFICATION (LDL):  <100     mg/dL   Optimal  100-129  mg/dL   Near or Above                    Optimal  130-159  mg/dL   Borderline  160-189  mg/dL   High  >190     mg/dL   Very High Performed at [facility], [address] [place], [place] [address]   CBC with Differential/Platelet     Status: Abnormal   Collection Time: 04/19/20 10:18 AM  Result Value Ref Range   WBC 10.5 4.0 - 10.5 K/uL   RBC 5.24 (H) 3.87 - 5.11 MIL/uL   Hemoglobin 15.6 (H) 12.0 - 15.0 g/dL   HCT 46.5 (H) 36 - 46 %   MCV 88.7 80.0 - 100.0 fL   MCH 29.8 26.0 - 34.0 pg   MCHC 33.5 30.0 - 36.0 g/dL   RDW 13.0 11.5 - 15.5 %   Platelets 211 150 - 400 K/uL   nRBC 0.0 0.0 - 0.2 %   Neutrophils Relative % 66 %   Neutro Abs 7.0 1.7 - 7.7 K/uL   Lymphocytes Relative 25 %   Lymphs Abs 2.6 0.7 - 4.0 K/uL   Monocytes Relative 5 %   Monocytes Absolute 0.5 0 - 1 K/uL   Eosinophils Relative 2 %   Eosinophils Absolute 0.3 0 - 0 K/uL   Basophils Relative 1 %   Basophils Absolute 0.1 0 - 0 K/uL   Immature Granulocytes 1 %   Abs Immature Granulocytes 0.05 0.00 - 0.07 K/uL    Comment: Performed at [facility], [address] [place], [place] [address]  Vitamin B12     Status: None   Collection Time: 04/19/20 10:18 AM  Result Value Ref Range   Vitamin B-12 543 180 - 914 pg/mL    Comment: (NOTE) This assay is not validated for testing neonatal  or myeloproliferative syndrome specimens for Vitamin B12 levels. Performed at [facility], [place] [address], [place], [place] [address]   Ferritin     Status: None   Collection Time: 04/19/20 10:18 AM  Result Value Ref Range   Ferritin 94 11 - 307 ng/mL    Comment: Performed at [facility], [address] [place], [place] [address]  Iron and TIBC     Status: None   Collection Time: 04/19/20 10:18 AM  Result Value Ref Range   Iron 81 28 - 170 ug/dL   TIBC 347 250 - 450 ug/dL   Saturation Ratios 23 10.4 - 31.8 %   UIBC 266 ug/dL    Comment: Performed at [facility], [address] [place], [place] [address]  TSH     Status: None   Collection Time: 04/19/20 10:18 AM  Result Value Ref Range   TSH 1.970 0.350 - 4.500 uIU/mL    Comment: Performed by a 3rd Generation assay with a functional sensitivity of <=0.01 uIU/mL. Performed at [facility], [address] [place],  [address]   T4, free  Status: None   Collection Time: 04/19/20 10:18 AM  Result Value Ref Range   Free T4 0.95 0.61 - 1.12 ng/dL    Comment: (NOTE) Biotin ingestion may interfere with free T4 tests. If the results are inconsistent with the TSH level, previous test results, or the clinical presentation, then consider biotin interference. If needed, order repeat testing after stopping biotin. Performed at [facility], [address] [place], [place] [address]   VITAMIN D 25 Hydroxy (Vit-D Deficiency, Fractures)     Status: Abnormal   Collection Time: 04/19/20 10:18 AM  Result Value Ref Range   Vit D, 25-Hydroxy 22.77 (L) 30 - 100 ng/mL    Comment: (NOTE) Vitamin D deficiency has been defined by the [place] practice guideline as a level of serum 25-OH  vitamin D less than 20 ng/mL (1,2). The Endocrine Society went on to  further define vitamin D insufficiency as a level between 21 and 29  ng/mL (2).  1. IOM  (Institute of Medicine). 2010. Dietary reference intakes for  calcium and D. [place]: The [employer]. 2. Holick MF, Binkley Eugenio Saenz, Bischoff-Ferrari HA, et al. Evaluation,  treatment, and prevention of vitamin D deficiency: an Endocrine  Society clinical practice guideline, JCEM. 2011 Jul; 96(7): 1911-30.  Performed at [facility], [place] [address] [place], [place] [address]   Folate, serum, performed at [facility] lab     Status: None   Collection Time: 04/20/20  9:09 AM  Result Value Ref Range   Folate 68.0 >5.9 ng/mL    Comment: RESULT CONFIRMED BY MANUAL DILUTION...[facility] Performed at [facility], [address], [place], [place] [address]     Radiology: No results found.  No results found.  No results found.    Assessment and Plan: Patient Active Problem List   Diagnosis Date Noted  . Gastroenteritis 07/01/2019  . Obstructive chronic bronchitis without exacerbation ([place]) 07/01/2019  . Screening for breast cancer 12/13/2018  . Cigarette smoker 12/13/2018  . Dysuria 12/13/2018  . Exposure to sexually transmitted disease (STD) 12/13/2018  . Sleep apnea 03/18/2018  . Essential hypertension 03/18/2018  . Hyperlipidemia 03/18/2018    1. OSA continue with CPAP she has got good tolerance of the on mask.  Good tolerance of the positive airway pressure.  Plan is going to be to continue with current settings. 2. COPD medical management inhalers as necessary.  We will continue to follow along 3. GERD no active issues with abdominal pain continue with supportive care 4. Obesity dietary management exercise weight loss discussed  General Counseling: I have discussed the findings of the evaluation and examination with [name].  I have also discussed any further diagnostic evaluation thatmay be needed or ordered today. Shima verbalizes understanding of the findings of todays visit. We also reviewed her medications today and discussed drug interactions  and side effects including but not limited excessive drowsiness and altered mental states. We also discussed that there is always a risk not just to her but also people around her. she has been encouraged to call the office with any questions or concerns that should arise related to todays visit.  No orders of the defined types were placed in this encounter.    Time spent: 32min  I have personally obtained a history, examined the patient, evaluated laboratory and imaging results, formulated the assessment and plan and placed orders.    Allyne Gee, MD [facility] Pulmonary and Critical  Care Sleep medicine

## 2020-05-25 ENCOUNTER — Other Ambulatory Visit: Payer: Self-pay

## 2020-05-25 DIAGNOSIS — E782 Mixed hyperlipidemia: Secondary | ICD-10-CM

## 2020-05-25 MED ORDER — SIMVASTATIN 20 MG PO TABS: 20.0000 mg | ORAL_TABLET | Freq: Every day | ORAL | 5 refills | Status: DC

## 2020-06-18 ENCOUNTER — Other Ambulatory Visit: Payer: Self-pay

## 2020-06-18 DIAGNOSIS — E782 Mixed hyperlipidemia: Secondary | ICD-10-CM

## 2020-06-18 MED ORDER — EZETIMIBE 10 MG PO TABS: 10.0000 mg | ORAL_TABLET | Freq: Every day | ORAL | 5 refills | Status: DC

## 2020-06-19 ENCOUNTER — Other Ambulatory Visit: Payer: Self-pay

## 2020-06-19 DIAGNOSIS — F17210 Nicotine dependence, cigarettes, uncomplicated: Secondary | ICD-10-CM

## 2020-06-19 MED ORDER — VARENICLINE TARTRATE 1 MG PO TABS: 1.0000 mg | ORAL_TABLET | ORAL | 2 refills | Status: DC

## 2020-06-26 ENCOUNTER — Other Ambulatory Visit: Payer: Self-pay

## 2020-06-26 DIAGNOSIS — I10 Essential (primary) hypertension: Secondary | ICD-10-CM

## 2020-06-26 MED ORDER — METOPROLOL SUCCINATE 100 MG PO CS24: 100.0000 mg | EXTENDED_RELEASE_CAPSULE | Freq: Every day | ORAL | 3 refills | Status: DC

## 2020-08-16 ENCOUNTER — Encounter: Payer: Self-pay | Admitting: Hospice and Palliative Medicine

## 2020-08-16 ENCOUNTER — Ambulatory Visit: Payer: Federal, State, Local not specified - PPO | Admitting: Hospice and Palliative Medicine

## 2020-08-16 DIAGNOSIS — R7303 Prediabetes: Secondary | ICD-10-CM | POA: Diagnosis not present

## 2020-08-16 DIAGNOSIS — Z23 Encounter for immunization: Secondary | ICD-10-CM | POA: Diagnosis not present

## 2020-08-16 DIAGNOSIS — R7301 Impaired fasting glucose: Secondary | ICD-10-CM | POA: Diagnosis not present

## 2020-08-16 DIAGNOSIS — I10 Essential (primary) hypertension: Secondary | ICD-10-CM | POA: Diagnosis not present

## 2020-08-16 LAB — POCT GLYCOSYLATED HEMOGLOBIN (HGB A1C): Hemoglobin A1C: 5.9 % — AB (ref 4.0–5.6)

## 2020-08-16 MED ORDER — AMLODIPINE BESYLATE 10 MG PO TABS: 10.0000 mg | ORAL_TABLET | Freq: Every day | ORAL | 1 refills | Status: DC

## 2020-08-16 MED ORDER — FUROSEMIDE 20 MG PO TABS: 20.0000 mg | ORAL_TABLET | Freq: Every day | ORAL | 1 refills | Status: DC | PRN

## 2020-08-16 NOTE — Progress Notes (Signed)
[facility] [place], [place] [address]  Internal MEDICINE  Office Visit Note  Patient Name: Breanna Flores  [id number]  [id number]  Date of Service: 08/20/2020  Chief Complaint  Patient presents with  . Follow-up  . Hypertension  . Hyperlipidemia  . Quality Metric Gaps    Hepc, TDAP    HPI Patient is here for routine follow-up She continues to be much more relaxed and less stressed now that she has retired We reviewed her recent lab work She was aware that at that time her glucose levels were elevated, she says she was fasting for these labs, since she has been focusing on healthy eating habits She would like an updated glucose level--discussed A1C levels and their interpretation--she would like to have this level checked  Followed by pulmonary for OSA and CPAP--no issues Has stopped smoking completely--feels breathing is at her baseline  She is aware of her health and health issues--takes them seriously and is cognizant about her diet, exercise and lifestyle habits, prefers a more natural approach to medicine rather than relying on multiple medications  Current Medication: Outpatient Encounter Medications as of 08/16/2020  Medication Sig  . albuterol (VENTOLIN HFA) 108 (90 Base) MCG/ACT inhaler Inhale 2 puffs into the lungs every 6 (six) hours as needed for wheezing or shortness of breath.  Marland Kitchen amLODipine (NORVASC) 10 MG tablet Take 1 tablet (10 mg total) by mouth daily.  . budesonide-formoterol (SYMBICORT) 160-4.5 MCG/ACT inhaler Inhale 2 puffs into the lungs 2 (two) times daily.  Marland Kitchen ezetimibe (ZETIA) 10 MG tablet Take 1 tablet (10 mg total) by mouth daily.  . furosemide (LASIX) 20 MG tablet Take 1 tablet (20 mg total) by mouth daily as needed.  Marland Kitchen losartan-hydrochlorothiazide (HYZAAR) 100-25 MG tablet Take 1 tablet by mouth daily.  . Metoprolol Succinate 100 MG CS24 Take 100 mg by mouth daily.  . NON FORMULARY cpap device  . simvastatin (ZOCOR) 20 MG  tablet Take 1 tablet (20 mg total) by mouth daily.  . [DISCONTINUED] amLODipine (NORVASC) 10 MG tablet Take 1 tablet (10 mg total) by mouth daily.  . [DISCONTINUED] furosemide (LASIX) 20 MG tablet Take 1 tablet (20 mg total) by mouth daily as needed.  . [DISCONTINUED] varenicline (CHANTIX CONTINUING MONTH PAK) 1 MG tablet Take 1 tablet (1 mg total) by mouth See admin instructions.   No facility-administered encounter medications on file as of 08/16/2020.    Surgical History: Past Surgical History:  Procedure Laterality Date  . [place] History: Past Medical History:  Diagnosis Date  . Hyperlipidemia   . Hypertension   . Sleep apnea     Family History: Family History  Problem Relation Age of Onset  . Dementia Mother   . Heart murmur Mother   . Hypertension Father   . Gout Father   . Hyperlipidemia Father     Social History   Socioeconomic History  . Marital status: Married    Spouse name: Not on file  . Number of children: Not on file  . Years of education: Not on file  . Highest education level: Not on file  Occupational History  . Not on file  Tobacco Use  . Smoking status: Former Smoker    Quit date: 2016    Years since quitting: 5.7  . Smokeless tobacco: Never Used  Substance and Sexual Activity  . Alcohol use: Yes    Comment: rarely  . Drug use: Never  .  Sexual activity: Not on file  Other Topics Concern  . Not on file  Social History Narrative  . Not on file   Social Determinants of Health   Financial Resource Strain:   . Difficulty of Paying Living Expenses: Not on file  Food Insecurity:   . Worried About Charity fundraiser in the Last Year: Not on file  . Ran Out of Food in the Last Year: Not on file  Transportation Needs:   . Lack of Transportation (Medical): Not on file  . Lack of Transportation (Non-Medical): Not on file  Physical Activity:   . Days of Exercise per Week: Not on file  . Minutes of Exercise per  Session: Not on file  Stress:   . Feeling of Stress : Not on file  Social Connections:   . Frequency of Communication with Friends and Family: Not on file  . Frequency of Social Gatherings with Friends and Family: Not on file  . Attends Religious Services: Not on file  . Active Member of Clubs or Organizations: Not on file  . Attends Archivist Meetings: Not on file  . Marital Status: Not on file  Intimate Partner Violence:   . Fear of Current or Ex-Partner: Not on file  . Emotionally Abused: Not on file  . Physically Abused: Not on file  . Sexually Abused: Not on file   Review of Systems  Constitutional: Negative for chills, diaphoresis and fatigue.  HENT: Negative for ear pain, postnasal drip and sinus pressure.   Eyes: Negative for photophobia, discharge, redness, itching and visual disturbance.  Respiratory: Negative for cough, shortness of breath and wheezing.   Cardiovascular: Negative for chest pain, palpitations and leg swelling.  Gastrointestinal: Negative for abdominal pain, constipation, diarrhea, nausea and vomiting.  Genitourinary: Negative for dysuria and flank pain.  Musculoskeletal: Negative for arthralgias, back pain, gait problem and neck pain.  Skin: Negative for color change.  Allergic/Immunologic: Negative for environmental allergies and food allergies.  Neurological: Negative for dizziness and headaches.  Hematological: Does not bruise/bleed easily.  Psychiatric/Behavioral: Negative for agitation, behavioral problems (depression) and hallucinations.    Vital Signs: BP 132/74   Pulse 65   Temp (!) 97.4 F (36.3 C)   Resp 16   Ht 5\' 9"  (1.753 m)   Wt 234 lb 9.6 oz (106.4 kg)   SpO2 97%   BMI 34.64 kg/m    Physical Exam Vitals reviewed.  Constitutional:      Appearance: Normal appearance. She is obese.  Cardiovascular:     Rate and Rhythm: Normal rate and regular rhythm.     Pulses: Normal pulses.     Heart sounds: Normal heart sounds.   Pulmonary:     Effort: Pulmonary effort is normal.     Breath sounds: Normal breath sounds.  Abdominal:     General: Abdomen is flat.  Musculoskeletal:        General: Normal range of motion.     Cervical back: Normal range of motion.  Skin:    General: Skin is warm.  Neurological:     General: No focal deficit present.     Mental Status: She is alert. Mental status is at baseline.  Psychiatric:        Mood and Affect: Mood normal.        Thought Content: Thought content normal.    Assessment/Plan: 1. Pre-diabetes A1C today 5.9, discussed that this levels is considered pre-diabetic placing her at high risk of becoming diabetic  We discussed in length dietary changes that could be made to help lower her glucose levels--starches in moderation, avoid fruit drinks high in sugar Encouraged her to exercise daily as tolerated Advised to keep a food diary and to bring to next appointment Will recheck A1C level in 3 months  2. Essential hypertension BP and HR stable today, requesting refills, continue with routine monitoring - amLODipine (NORVASC) 10 MG tablet; Take 1 tablet (10 mg total) by mouth daily.  Dispense: 90 tablet; Refill: 1 - furosemide (LASIX) 20 MG tablet; Take 1 tablet (20 mg total) by mouth daily as needed.  Dispense: 90 tablet; Refill: 1  3. Impaired fasting glucose - POCT HgB A1C  4. Flu vaccine need - Flu Vaccine MDCK QUAD PF  General Counseling: Khamiyah verbalizes understanding of the findings of todays visit and agrees with plan of treatment. I have discussed any further diagnostic evaluation that may be needed or ordered today. We also reviewed her medications today. she has been encouraged to call the office with any questions or concerns that should arise related to todays visit.    Orders Placed This Encounter  Procedures  . Flu Vaccine MDCK QUAD PF  . POCT HgB A1C    Meds ordered this encounter  Medications  . amLODipine (NORVASC) 10 MG tablet     Sig: Take 1 tablet (10 mg total) by mouth daily.    Dispense:  90 tablet    Refill:  1  . furosemide (LASIX) 20 MG tablet    Sig: Take 1 tablet (20 mg total) by mouth daily as needed.    Dispense:  90 tablet    Refill:  1    Time spent: 30 Minutes   This patient was seen by Theodoro Grist AGNP-C in Collaboration with Dr Lavera Guise as a part of collaborative care agreement     Tanna Furry. Makalya Nave AGNP-C Internal medicine

## 2020-08-20 ENCOUNTER — Encounter: Payer: Self-pay | Admitting: Hospice and Palliative Medicine

## 2020-09-17 ENCOUNTER — Other Ambulatory Visit: Payer: Self-pay | Admitting: Hospice and Palliative Medicine

## 2020-09-17 DIAGNOSIS — Z1231 Encounter for screening mammogram for malignant neoplasm of breast: Secondary | ICD-10-CM

## 2020-10-17 ENCOUNTER — Other Ambulatory Visit: Payer: Self-pay

## 2020-10-17 DIAGNOSIS — E782 Mixed hyperlipidemia: Secondary | ICD-10-CM

## 2020-10-17 MED ORDER — EZETIMIBE 10 MG PO TABS: 10.0000 mg | ORAL_TABLET | Freq: Every day | ORAL | 5 refills | Status: DC

## 2020-10-29 ENCOUNTER — Other Ambulatory Visit: Payer: Self-pay

## 2020-10-29 ENCOUNTER — Ambulatory Visit
Admission: RE | Admit: 2020-10-29 | Discharge: 2020-10-29 | Disposition: A | Discharge status: Home / Self Care | Payer: Federal, State, Local not specified - PPO | Source: Ambulatory Visit | Attending: Hospice and Palliative Medicine | Admitting: Hospice and Palliative Medicine

## 2020-10-29 DIAGNOSIS — Z1231 Encounter for screening mammogram for malignant neoplasm of breast: Secondary | ICD-10-CM | POA: Insufficient documentation

## 2020-11-16 ENCOUNTER — Ambulatory Visit: Payer: Federal, State, Local not specified - PPO | Admitting: Hospice and Palliative Medicine

## 2020-11-19 ENCOUNTER — Other Ambulatory Visit: Payer: Self-pay

## 2020-11-19 DIAGNOSIS — E782 Mixed hyperlipidemia: Secondary | ICD-10-CM

## 2020-11-19 DIAGNOSIS — I10 Essential (primary) hypertension: Secondary | ICD-10-CM

## 2020-11-19 MED ORDER — SIMVASTATIN 20 MG PO TABS: 20.0000 mg | ORAL_TABLET | Freq: Every day | ORAL | 5 refills | Status: DC

## 2020-11-19 MED ORDER — LOSARTAN POTASSIUM-HCTZ 100-25 MG PO TABS: 1.0000 | ORAL_TABLET | Freq: Every day | ORAL | 5 refills | Status: DC

## 2020-11-22 ENCOUNTER — Ambulatory Visit: Payer: Federal, State, Local not specified - PPO | Admitting: Internal Medicine

## 2020-12-17 ENCOUNTER — Ambulatory Visit: Payer: Federal, State, Local not specified - PPO | Admitting: Hospice and Palliative Medicine

## 2020-12-17 ENCOUNTER — Other Ambulatory Visit: Payer: Self-pay

## 2020-12-17 ENCOUNTER — Encounter: Payer: Self-pay | Admitting: Hospice and Palliative Medicine

## 2020-12-17 VITALS — BP 134/76 | HR 79 | Temp 97.5°F | Resp 16 | Ht 69.0 in | Wt 234.0 lb

## 2020-12-17 DIAGNOSIS — R300 Dysuria: Secondary | ICD-10-CM

## 2020-12-17 DIAGNOSIS — Z6834 Body mass index (BMI) 34.0-34.9, adult: Secondary | ICD-10-CM

## 2020-12-17 DIAGNOSIS — F17219 Nicotine dependence, cigarettes, with unspecified nicotine-induced disorders: Secondary | ICD-10-CM | POA: Diagnosis not present

## 2020-12-17 DIAGNOSIS — I10 Essential (primary) hypertension: Secondary | ICD-10-CM | POA: Diagnosis not present

## 2020-12-17 DIAGNOSIS — R7301 Impaired fasting glucose: Secondary | ICD-10-CM

## 2020-12-17 DIAGNOSIS — Z0001 Encounter for general adult medical examination with abnormal findings: Secondary | ICD-10-CM

## 2020-12-17 DIAGNOSIS — Z1211 Encounter for screening for malignant neoplasm of colon: Secondary | ICD-10-CM | POA: Diagnosis not present

## 2020-12-17 DIAGNOSIS — D229 Melanocytic nevi, unspecified: Secondary | ICD-10-CM

## 2020-12-17 MED ORDER — VARENICLINE TARTRATE 1 MG PO TABS: 1.0000 mg | ORAL_TABLET | Freq: Two times a day (BID) | ORAL | 1 refills | Status: DC

## 2020-12-17 MED ORDER — LOSARTAN POTASSIUM-HCTZ 100-25 MG PO TABS: 1.0000 | ORAL_TABLET | Freq: Every day | ORAL | 1 refills | Status: DC

## 2020-12-17 NOTE — Progress Notes (Signed)
[facility] [place], [place] [address]  Internal MEDICINE  Office Visit Note  Patient Name: Breanna Flores  [id number]  [id number]  Date of Service: 12/20/2020  Chief Complaint  Patient presents with   Hyperlipidemia   Hypertension     HPI Pt is here for routine health maintenance examination Overall, things have been going well She has been working on her diet as her A1C was elevated at last visit 5.9 Primarily eats a vegetarian diet and consumes lentils mostly for protien  Has restarted smoking a few cigarettes per day--would like to start back on Chantix to help with cessation, will go periods without smoking but is triggered by stress Continues to wear CPAP nightly with no issues for OSA Breathing remains well controlled--history of COPD  PHM: Mammogram completed Dec 2021, normal, repeat in 1 year Colonoscopy due for repeat screening  Current Medication: Outpatient Encounter Medications as of 12/17/2020  Medication Sig   varenicline (CHANTIX CONTINUING MONTH PAK) 1 MG tablet Take 1 tablet (1 mg total) by mouth 2 (two) times daily.   albuterol (VENTOLIN HFA) 108 (90 Base) MCG/ACT inhaler Inhale 2 puffs into the lungs every 6 (six) hours as needed for wheezing or shortness of breath.   amLODipine (NORVASC) 10 MG tablet Take 1 tablet (10 mg total) by mouth daily.   budesonide-formoterol (SYMBICORT) 160-4.5 MCG/ACT inhaler Inhale 2 puffs into the lungs 2 (two) times daily.   ezetimibe (ZETIA) 10 MG tablet Take 1 tablet (10 mg total) by mouth daily.   furosemide (LASIX) 20 MG tablet Take 1 tablet (20 mg total) by mouth daily as needed.   losartan-hydrochlorothiazide (HYZAAR) 100-25 MG tablet Take 1 tablet by mouth daily.   Metoprolol Succinate 100 MG CS24 Take 100 mg by mouth daily.   NON FORMULARY cpap device   simvastatin (ZOCOR) 20 MG tablet Take 1 tablet (20 mg total) by mouth daily.   [DISCONTINUED] losartan-hydrochlorothiazide  (HYZAAR) 100-25 MG tablet Take 1 tablet by mouth daily.   No facility-administered encounter medications on file as of 12/17/2020.    Surgical History: Past Surgical History:  Procedure Laterality Date   EYE SURGERY  1963, 1978    Medical History: Past Medical History:  Diagnosis Date   Hyperlipidemia    Hypertension    Sleep apnea     Family History: Family History  Problem Relation Age of Onset   Dementia Mother    Heart murmur Mother    Hypertension Father    Gout Father    Hyperlipidemia Father       Review of Systems  Constitutional: Negative for chills, diaphoresis and fatigue.  HENT: Negative for ear pain, postnasal drip and sinus pressure.   Eyes: Negative for photophobia, discharge, redness, itching and visual disturbance.  Respiratory: Negative for cough, shortness of breath and wheezing.   Cardiovascular: Negative for chest pain, palpitations and leg swelling.  Gastrointestinal: Negative for abdominal pain, constipation, diarrhea, nausea and vomiting.  Genitourinary: Negative for dysuria and flank pain.  Musculoskeletal: Negative for arthralgias, back pain, gait problem and neck pain.  Skin: Negative for color change.  Allergic/Immunologic: Negative for environmental allergies and food allergies.  Neurological: Negative for dizziness and headaches.  Hematological: Does not bruise/bleed easily.  Psychiatric/Behavioral: Negative for agitation, behavioral problems (depression) and hallucinations.     Vital Signs: BP 134/76    Pulse 79    Temp (!) 97.5 F (36.4 C)    Resp 16    Ht 5\' 9"  (1.753 m)  Wt 234 lb (106.1 kg)    SpO2 97%    BMI 34.56 kg/m    Physical Exam Vitals reviewed.  Constitutional:      Appearance: Normal appearance. She is obese.  Cardiovascular:     Rate and Rhythm: Normal rate and regular rhythm.     Pulses: Normal pulses.     Heart sounds: Normal heart sounds.  Pulmonary:     Effort: Pulmonary effort is normal.      Breath sounds: Normal breath sounds.  Chest:  Breasts:     Right: Normal.     Left: Normal.    Abdominal:     General: Abdomen is flat.     Palpations: Abdomen is soft.  Musculoskeletal:        General: Normal range of motion.     Cervical back: Normal range of motion.  Skin:    General: Skin is warm.     Comments: Scattered atypical moles  Neurological:     General: No focal deficit present.     Mental Status: She is alert and oriented to person, place, and time. Mental status is at baseline.  Psychiatric:        Mood and Affect: Mood normal.        Behavior: Behavior normal.        Thought Content: Thought content normal.        Judgment: Judgment normal.      LABS: Recent Results (from the past 2160 hour(s))  UA/M w/rflx Culture, Routine     Status: None   Collection Time: 12/17/20 10:05 AM   Specimen: Urine   Urine  Result Value Ref Range   Specific Gravity, UA 1.009 1.005 - 1.030   pH, UA 6.5 5.0 - 7.5   Color, UA Yellow Yellow   Appearance Ur Clear Clear   Leukocytes,UA Negative Negative   Protein,UA Negative Negative/Trace   Glucose, UA Negative Negative   Ketones, UA Negative Negative   RBC, UA Negative Negative   Bilirubin, UA Negative Negative   Urobilinogen, Ur 0.2 0.2 - 1.0 mg/dL   Nitrite, UA Negative Negative   Microscopic Examination Comment     Comment: Microscopic follows if indicated.   Microscopic Examination See below:     Comment: Microscopic was indicated and was performed.   Urinalysis Reflex Comment     Comment: This specimen has reflexed to a Urine Culture.  Microscopic Examination     Status: Abnormal   Collection Time: 12/17/20 10:05 AM   Urine  Result Value Ref Range   WBC, UA 6-10 (A) 0 - 5 /hpf   RBC None seen 0 - 2 /hpf   Epithelial Cells (non renal) 0-10 0 - 10 /hpf   Casts None seen None seen /lpf   Bacteria, UA None seen None seen/Few  Urine Culture, Reflex     Status: None   Collection Time: 12/17/20 10:05 AM   Urine   Result Value Ref Range   Urine Culture, Routine Final report    Organism ID, Bacteria No growth     Assessment/Plan: 1. Encounter for routine adult health examination with abnormal findings Well appearing 60 year old female Orders placed to update PHM Lab slip provided to have routine fasting labs  2. Essential hypertension BP and HR well controlled on current therapy Requesting refills - losartan-hydrochlorothiazide (HYZAAR) 100-25 MG tablet; Take 1 tablet by mouth daily.  Dispense: 90 tablet; Refill: 1  3. Screening for colon cancer - Ambulatory referral to Gastroenterology  4. Impaired fasting glucose A1C improved today at 5.6-continue with diet control and increasing physical activity levels - POCT HgB A1C  5. Adult BMI 34.0-34.9 kg/sq m Obesity Counseling: Risk Assessment: An assessment of behavioral risk factors was made today and includes lack of exercise sedentary lifestyle, lack of portion control and poor dietary habits.  Risk Modification Advice: She was counseled on portion control guidelines. Restricting daily caloric intake to 1800. The detrimental long term effects of obesity on her health and ongoing poor compliance was also discussed with the patient.  6. Atypical mole Referral to dermatology for further evaluation and treatment - Ambulatory referral to Dermatology  7. Cigarette nicotine dependence with nicotine-induced disorder Resume Chanitx to assist with smoking cessation Smoking cessation counseling: 1. Pt acknowledges the risks of long term smoking, she will try to quite smoking. 2. Options for different medications including nicotine products, chewing gum, patch etc, Wellbutrin and Chantix is discussed 3. Goal and date of compete cessation is discussed 4. Total time spent in smoking cessation is 15 min. - varenicline (CHANTIX CONTINUING MONTH PAK) 1 MG tablet; Take 1 tablet (1 mg total) by mouth 2 (two) times daily.  Dispense: 60 tablet; Refill:  1  8. Dysuria - UA/M w/rflx Culture, Routine - Microscopic Examination - Urine Culture, Reflex  General Counseling: Elyannah verbalizes understanding of the findings of todays visit and agrees with plan of treatment. I have discussed any further diagnostic evaluation that may be needed or ordered today. We also reviewed her medications today. she has been encouraged to call the office with any questions or concerns that should arise related to todays visit.    Counseling:    Orders Placed This Encounter  Procedures   Microscopic Examination   Urine Culture, Reflex   UA/M w/rflx Culture, Routine   Ambulatory referral to Gastroenterology   Ambulatory referral to Dermatology   POCT HgB A1C    Meds ordered this encounter  Medications   losartan-hydrochlorothiazide (HYZAAR) 100-25 MG tablet    Sig: Take 1 tablet by mouth daily.    Dispense:  90 tablet    Refill:  1   varenicline (CHANTIX CONTINUING MONTH PAK) 1 MG tablet    Sig: Take 1 tablet (1 mg total) by mouth 2 (two) times daily.    Dispense:  60 tablet    Refill:  1    Total time spent: 35 Minutes  Time spent includes review of chart, medications, test results, and follow up plan with the patient.   This patient was seen by Theodoro Grist AGNP-C Collaboration with Dr Lavera Guise as a part of collaborative care agreement   Tanna Furry. [facility] Internal Medicine

## 2020-12-19 LAB — UA/M W/RFLX CULTURE, ROUTINE
Bilirubin, UA: NEGATIVE
Glucose, UA: NEGATIVE
Ketones, UA: NEGATIVE
Leukocytes,UA: NEGATIVE
Nitrite, UA: NEGATIVE
Protein,UA: NEGATIVE
RBC, UA: NEGATIVE
Specific Gravity, UA: 1.009 (ref 1.005–1.030)
Urobilinogen, Ur: 0.2 mg/dL (ref 0.2–1.0)
pH, UA: 6.5 (ref 5.0–7.5)

## 2020-12-19 LAB — URINE CULTURE, REFLEX: Organism ID, Bacteria: NO GROWTH

## 2020-12-19 LAB — MICROSCOPIC EXAMINATION
Bacteria, UA: NONE SEEN
Casts: NONE SEEN /lpf
RBC, Urine: NONE SEEN /hpf (ref 0–2)

## 2020-12-20 ENCOUNTER — Encounter: Payer: Self-pay | Admitting: Hospice and Palliative Medicine

## 2020-12-20 LAB — POCT GLYCOSYLATED HEMOGLOBIN (HGB A1C): Hemoglobin A1C: 5.6 % (ref 4.0–5.6)

## 2020-12-21 ENCOUNTER — Telehealth (INDEPENDENT_AMBULATORY_CARE_PROVIDER_SITE_OTHER): Payer: Self-pay | Admitting: Gastroenterology

## 2020-12-21 DIAGNOSIS — Z1211 Encounter for screening for malignant neoplasm of colon: Secondary | ICD-10-CM

## 2020-12-21 NOTE — Progress Notes (Signed)
Gastroenterology Pre-Procedure Review  Request Date: Not Scheduled At This Time ([place]) Requesting Physician: Dr.   PATIENT REVIEW QUESTIONS: The patient responded to the following health history questions as indicated:    1. Are you having any GI issues? no 2. Do you have a personal history of Polyps? no 3. Do you have a family history of Colon Cancer or Polyps? yes (Maternal grandmother colon cancer) 4. Diabetes Mellitus? no 5. Joint replacements in the past 12 months?no 6. Major health problems in the past 3 months?no 7. Any artificial heart valves, MVP, or defibrillator?no    MEDICATIONS & ALLERGIES:    Patient reports the following regarding taking any anticoagulation/antiplatelet therapy:   Plavix, Coumadin, Eliquis, Xarelto, Lovenox, Pradaxa, Brilinta, or Effient? no Aspirin? no  Patient confirms/reports the following medications:  Current Outpatient Medications  Medication Sig Dispense Refill  . albuterol (VENTOLIN HFA) 108 (90 Base) MCG/ACT inhaler Inhale 2 puffs into the lungs every 6 (six) hours as needed for wheezing or shortness of breath. 18 g 2  . amLODipine (NORVASC) 10 MG tablet Take 1 tablet (10 mg total) by mouth daily. 90 tablet 1  . budesonide-formoterol (SYMBICORT) 160-4.5 MCG/ACT inhaler Inhale 2 puffs into the lungs 2 (two) times daily. 1 Inhaler 3  . ezetimibe (ZETIA) 10 MG tablet Take 1 tablet (10 mg total) by mouth daily. 30 tablet 5  . furosemide (LASIX) 20 MG tablet Take 1 tablet (20 mg total) by mouth daily as needed. 90 tablet 1  . losartan-hydrochlorothiazide (HYZAAR) 100-25 MG tablet Take 1 tablet by mouth daily. 90 tablet 1  . Metoprolol Succinate 100 MG CS24 Take 100 mg by mouth daily. 30 capsule 3  . NON FORMULARY cpap device    . simvastatin (ZOCOR) 20 MG tablet Take 1 tablet (20 mg total) by mouth daily. 30 tablet 5  . valsartan (DIOVAN) 160 MG tablet Take by mouth.    . varenicline (CHANTIX CONTINUING MONTH PAK) 1 MG tablet Take  1 tablet (1 mg total) by mouth 2 (two) times daily. 60 tablet 1  . losartan (COZAAR) 100 MG tablet Take 100 mg by mouth daily. (Patient not taking: Reported on 12/21/2020)     No current facility-administered medications for this visit.    Patient confirms/reports the following allergies:  Allergies  Allergen Reactions  . Erythromycin Rash  . Penicillins Rash  . Tape Rash    Specifically ACE wrap     No orders of the defined types were placed in this encounter.   AUTHORIZATION INFORMATION Primary Insurance: 1D#: Group #:  Secondary Insurance: 1D#: Group #:  SCHEDULE INFORMATION: Date: Not scheduled at this time due to declined COVID Test requirement Time: Location:

## 2020-12-26 ENCOUNTER — Ambulatory Visit: Payer: Federal, State, Local not specified - PPO | Admitting: Hospice and Palliative Medicine

## 2020-12-31 ENCOUNTER — Ambulatory Visit: Payer: Federal, State, Local not specified - PPO | Admitting: Internal Medicine

## 2021-01-17 ENCOUNTER — Encounter: Payer: Self-pay | Admitting: Internal Medicine

## 2021-01-17 ENCOUNTER — Ambulatory Visit: Payer: Federal, State, Local not specified - PPO | Admitting: Internal Medicine

## 2021-01-17 VITALS — BP 140/74 | HR 72 | Temp 97.8°F | Resp 16 | Ht 69.0 in | Wt 236.0 lb

## 2021-01-17 DIAGNOSIS — Z6834 Body mass index (BMI) 34.0-34.9, adult: Secondary | ICD-10-CM

## 2021-01-17 DIAGNOSIS — Z7189 Other specified counseling: Secondary | ICD-10-CM | POA: Diagnosis not present

## 2021-01-17 DIAGNOSIS — G4733 Obstructive sleep apnea (adult) (pediatric): Secondary | ICD-10-CM

## 2021-01-17 NOTE — Progress Notes (Signed)
[facility] [place], [place] [address]  Pulmonary Sleep Medicine   Office Visit Note  Patient Name: Breanna Flores DOB: December 23, 1960 MRN [id number]  Date of Service: 01/17/2021  Complaints/HPI: OSA on CPAP. She is doing well with the CPAP excellent compliance. She has had not any issues with the machine. Not smoking at this time breathing is at baseline.  She still gets some shortness of breath with exertion suspect that that is partly due to her obesity making a contribution to her symptoms.  ROS  General: (-) fever, (-) chills, (-) night sweats, (-) weakness Skin: (-) rashes, (-) itching,. Eyes: (-) visual changes, (-) redness, (-) itching. Nose and Sinuses: (-) nasal stuffiness or itchiness, (-) postnasal drip, (-) nosebleeds, (-) sinus trouble. Mouth and Throat: (-) sore throat, (-) hoarseness. Neck: (-) swollen glands, (-) enlarged thyroid, (-) neck pain. Respiratory: - cough, (-) bloody sputum, - shortness of breath, - wheezing. Cardiovascular: - ankle swelling, (-) chest pain. Lymphatic: (-) lymph node enlargement. Neurologic: (-) numbness, (-) tingling. Psychiatric: (-) anxiety, (-) depression   Current Medication: Outpatient Encounter Medications as of 01/17/2021  Medication Sig  . amLODipine (NORVASC) 10 MG tablet Take 1 tablet (10 mg total) by mouth daily.  Marland Kitchen ezetimibe (ZETIA) 10 MG tablet Take 1 tablet (10 mg total) by mouth daily.  . furosemide (LASIX) 20 MG tablet Take 1 tablet (20 mg total) by mouth daily as needed.  Marland Kitchen losartan (COZAAR) 100 MG tablet Take 100 mg by mouth daily.  Marland Kitchen losartan-hydrochlorothiazide (HYZAAR) 100-25 MG tablet Take 1 tablet by mouth daily.  . Metoprolol Succinate 100 MG CS24 Take 100 mg by mouth daily.  . NON FORMULARY cpap device  . simvastatin (ZOCOR) 20 MG tablet Take 1 tablet (20 mg total) by mouth daily.  . valsartan (DIOVAN) 160 MG tablet Take by mouth.  . varenicline (CHANTIX CONTINUING MONTH PAK) 1 MG  tablet Take 1 tablet (1 mg total) by mouth 2 (two) times daily.  . [DISCONTINUED] albuterol (VENTOLIN HFA) 108 (90 Base) MCG/ACT inhaler Inhale 2 puffs into the lungs every 6 (six) hours as needed for wheezing or shortness of breath.  . [DISCONTINUED] budesonide-formoterol (SYMBICORT) 160-4.5 MCG/ACT inhaler Inhale 2 puffs into the lungs 2 (two) times daily.   No facility-administered encounter medications on file as of 01/17/2021.    Surgical History: Past Surgical History:  Procedure Laterality Date  . [place] History: Past Medical History:  Diagnosis Date  . Hyperlipidemia   . Hypertension   . Sleep apnea     Family History: Family History  Problem Relation Age of Onset  . Dementia Mother   . Heart murmur Mother   . Hypertension Father   . Gout Father   . Hyperlipidemia Father     Social History: Social History   Socioeconomic History  . Marital status: Married    Spouse name: Not on file  . Number of children: Not on file  . Years of education: Not on file  . Highest education level: Not on file  Occupational History  . Not on file  Tobacco Use  . Smoking status: Former Smoker    Quit date: 2016    Years since quitting: 6.1  . Smokeless tobacco: Never Used  Substance and Sexual Activity  . Alcohol use: Yes    Comment: rarely  . Drug use: Never  . Sexual activity: Not on file  Other Topics Concern  . Not on  file  Social History Narrative  . Not on file   Social Determinants of Health   Financial Resource Strain: Not on file  Food Insecurity: Not on file  Transportation Needs: Not on file  Physical Activity: Not on file  Stress: Not on file  Social Connections: Not on file  Intimate Partner Violence: Not on file    Vital Signs: Blood pressure 140/74, pulse 72, temperature 97.8 F (36.6 C), resp. rate 16, height 5\' 9"  (1.753 m), weight 236 lb (107 kg), SpO2 98 %.  Examination: General Appearance: The patient is  well-developed, well-nourished, and in no distress. Skin: Gross inspection of skin unremarkable. Head: normocephalic, no gross deformities. Eyes: no gross deformities noted. ENT: ears appear grossly normal no exudates. Neck: Supple. No thyromegaly. No LAD. Respiratory: no rhonchi noted. Cardiovascular: Normal S1 and S2 without murmur or rub. Extremities: No cyanosis. pulses are equal. Neurologic: Alert and oriented. No involuntary movements.  LABS: Recent Results (from the past 2160 hour(s))  UA/M w/rflx Culture, Routine     Status: None   Collection Time: 12/17/20 10:05 AM   Specimen: Urine   Urine  Result Value Ref Range   Specific Gravity, UA 1.009 1.005 - 1.030   pH, UA 6.5 5.0 - 7.5   Color, UA Yellow Yellow   Appearance Ur Clear Clear   Leukocytes,UA Negative Negative   Protein,UA Negative Negative/Trace   Glucose, UA Negative Negative   Ketones, UA Negative Negative   RBC, UA Negative Negative   Bilirubin, UA Negative Negative   Urobilinogen, Ur 0.2 0.2 - 1.0 mg/dL   Nitrite, UA Negative Negative   Microscopic Examination Comment     Comment: Microscopic follows if indicated.   Microscopic Examination See below:     Comment: Microscopic was indicated and was performed.   Urinalysis Reflex Comment     Comment: This specimen has reflexed to a Urine Culture.  Microscopic Examination     Status: Abnormal   Collection Time: 12/17/20 10:05 AM   Urine  Result Value Ref Range   WBC, UA 6-10 (A) 0 - 5 /hpf   RBC None seen 0 - 2 /hpf   Epithelial Cells (non renal) 0-10 0 - 10 /hpf   Casts None seen None seen /lpf   Bacteria, UA None seen None seen/Few  Urine Culture, Reflex     Status: None   Collection Time: 12/17/20 10:05 AM   Urine  Result Value Ref Range   Urine Culture, Routine Final report    Organism ID, Bacteria No growth   POCT HgB A1C     Status: None   Collection Time: 12/20/20  1:48 PM  Result Value Ref Range   Hemoglobin A1C 5.6 4.0 - 5.6 %   HbA1c POC  (<> result, manual entry)     HbA1c, POC (prediabetic range)     HbA1c, POC (controlled diabetic range)      Radiology: MM 3D SCREEN BREAST BILATERAL  Result Date: 10/30/2020 CLINICAL DATA:  Screening. EXAM: DIGITAL SCREENING BILATERAL MAMMOGRAM WITH TOMO AND CAD COMPARISON:  Previous exam(s). ACR Breast Density Category b: There are scattered areas of fibroglandular density. FINDINGS: There are no findings suspicious for malignancy. Images were processed with CAD. IMPRESSION: No mammographic evidence of malignancy. A result letter of this screening mammogram will be mailed directly to the patient. RECOMMENDATION: Screening mammogram in one year. (Code:SM-B-01Y) BI-RADS CATEGORY  1: Negative. Electronically Signed   By: Claudie Revering M.D.   On: 10/30/2020 13:09  No results found.  No results found.    Assessment and Plan: Patient Active Problem List   Diagnosis Date Noted  . Gastroenteritis 07/01/2019  . Obstructive chronic bronchitis without exacerbation ([place]) 07/01/2019  . Screening for breast cancer 12/13/2018  . Cigarette smoker 12/13/2018  . Dysuria 12/13/2018  . Exposure to sexually transmitted disease (STD) 12/13/2018  . Sleep apnea 03/18/2018  . Essential hypertension 03/18/2018  . Hyperlipidemia 03/18/2018     1. OSA (obstructive sleep apnea) Continue with Pap therapy she has been doing well.  Her compliance has been good.  We will continue to follow along.   2. Adult BMI 34.0-34.9 kg/sq m Obesity Counseling: Had a lengthy discussion regarding patients BMI and weight issues. Patient was instructed on portion control as well as increased activity. Also discussed caloric restrictions with trying to maintain intake less than 2000 Kcal. Discussions were made in accordance with the 5As of weight management. Simple actions such as not eating late and if able to, taking a walk is suggested.   3. CPAP use counseling  CPAP Counseling: had a lengthy discussion with the  patient regarding the importance of PAP therapy in management of the sleep apnea. Patient appears to understand the risk factor reduction and also understands the risks associated with untreated sleep apnea. Patient will try to make a good faith effort to remain compliant with therapy. Also instructed the patient on proper cleaning of the device including the water must be changed daily if possible and use of distilled water is preferred. Patient understands that the machine should be regularly cleaned with appropriate recommended cleaning solutions that do not damage the PAP machine for example given white vinegar and water rinses. Other methods such as ozone treatment may not be as good as these simple methods to achieve cleaning.    General Counseling: I have discussed the findings of the evaluation and examination with [name].  I have also discussed any further diagnostic evaluation thatmay be needed or ordered today. Aviela verbalizes understanding of the findings of todays visit. We also reviewed her medications today and discussed drug interactions and side effects including but not limited excessive drowsiness and altered mental states. We also discussed that there is always a risk not just to her but also people around her. she has been encouraged to call the office with any questions or concerns that should arise related to todays visit.  No orders of the defined types were placed in this encounter.    Time spent: 93  I have personally obtained a history, examined the patient, evaluated laboratory and imaging results, formulated the assessment and plan and placed orders.    Allyne Gee, MD [facility] Pulmonary and Critical Care Sleep medicine

## 2021-01-17 NOTE — Patient Instructions (Signed)

## 2021-01-23 ENCOUNTER — Other Ambulatory Visit: Payer: Self-pay | Admitting: Nurse Practitioner

## 2021-01-23 DIAGNOSIS — I10 Essential (primary) hypertension: Secondary | ICD-10-CM

## 2021-02-04 ENCOUNTER — Other Ambulatory Visit: Payer: Self-pay

## 2021-02-04 DIAGNOSIS — I10 Essential (primary) hypertension: Secondary | ICD-10-CM

## 2021-02-04 MED ORDER — METOPROLOL SUCCINATE 100 MG PO CS24: 100.0000 mg | EXTENDED_RELEASE_CAPSULE | Freq: Every day | ORAL | 3 refills | Status: DC

## 2021-02-12 ENCOUNTER — Other Ambulatory Visit: Payer: Self-pay | Admitting: Hospice and Palliative Medicine

## 2021-02-12 DIAGNOSIS — F17219 Nicotine dependence, cigarettes, with unspecified nicotine-induced disorders: Secondary | ICD-10-CM

## 2021-02-12 NOTE — Telephone Encounter (Signed)
We can refills this

## 2021-03-14 ENCOUNTER — Other Ambulatory Visit: Payer: Self-pay | Admitting: Adult Health

## 2021-03-14 DIAGNOSIS — I10 Essential (primary) hypertension: Secondary | ICD-10-CM

## 2021-03-18 ENCOUNTER — Ambulatory Visit: Payer: Federal, State, Local not specified - PPO | Admitting: Internal Medicine

## 2021-03-21 ENCOUNTER — Other Ambulatory Visit: Payer: Self-pay | Admitting: Adult Health

## 2021-03-21 DIAGNOSIS — I10 Essential (primary) hypertension: Secondary | ICD-10-CM

## 2021-03-28 ENCOUNTER — Other Ambulatory Visit: Payer: Self-pay | Admitting: Adult Health

## 2021-03-28 DIAGNOSIS — I10 Essential (primary) hypertension: Secondary | ICD-10-CM

## 2021-04-16 ENCOUNTER — Other Ambulatory Visit: Payer: Self-pay | Admitting: Internal Medicine

## 2021-04-16 DIAGNOSIS — E782 Mixed hyperlipidemia: Secondary | ICD-10-CM

## 2021-04-17 ENCOUNTER — Other Ambulatory Visit: Payer: Self-pay

## 2021-04-17 DIAGNOSIS — I10 Essential (primary) hypertension: Secondary | ICD-10-CM

## 2021-04-17 MED ORDER — LOSARTAN POTASSIUM-HCTZ 100-25 MG PO TABS: 1.0000 | ORAL_TABLET | Freq: Every day | ORAL | 1 refills | Status: DC

## 2021-04-17 MED ORDER — METOPROLOL SUCCINATE 100 MG PO CS24: 100.0000 mg | EXTENDED_RELEASE_CAPSULE | Freq: Every day | ORAL | 1 refills | Status: DC

## 2021-04-17 MED ORDER — AMLODIPINE BESYLATE 10 MG PO TABS: 10.0000 mg | ORAL_TABLET | Freq: Every day | ORAL | 1 refills | Status: DC

## 2021-04-29 ENCOUNTER — Other Ambulatory Visit: Payer: Self-pay

## 2021-04-29 ENCOUNTER — Encounter: Payer: Self-pay | Admitting: Physician Assistant

## 2021-04-29 ENCOUNTER — Ambulatory Visit (INDEPENDENT_AMBULATORY_CARE_PROVIDER_SITE_OTHER): Payer: Federal, State, Local not specified - PPO | Admitting: Physician Assistant

## 2021-04-29 DIAGNOSIS — R5383 Other fatigue: Secondary | ICD-10-CM

## 2021-04-29 DIAGNOSIS — G4733 Obstructive sleep apnea (adult) (pediatric): Secondary | ICD-10-CM | POA: Diagnosis not present

## 2021-04-29 DIAGNOSIS — I10 Essential (primary) hypertension: Secondary | ICD-10-CM | POA: Diagnosis not present

## 2021-04-29 DIAGNOSIS — E559 Vitamin D deficiency, unspecified: Secondary | ICD-10-CM

## 2021-04-29 DIAGNOSIS — R7301 Impaired fasting glucose: Secondary | ICD-10-CM | POA: Diagnosis not present

## 2021-04-29 DIAGNOSIS — E538 Deficiency of other specified B group vitamins: Secondary | ICD-10-CM

## 2021-04-29 DIAGNOSIS — E782 Mixed hyperlipidemia: Secondary | ICD-10-CM

## 2021-04-29 LAB — POCT GLYCOSYLATED HEMOGLOBIN (HGB A1C): Hemoglobin A1C: 6.1 % — AB (ref 4.0–5.6)

## 2021-04-29 MED ORDER — EZETIMIBE 10 MG PO TABS: 10.0000 mg | ORAL_TABLET | Freq: Every day | ORAL | 5 refills | Status: DC

## 2021-04-29 MED ORDER — FUROSEMIDE 20 MG PO TABS: 20.0000 mg | ORAL_TABLET | Freq: Every day | ORAL | 1 refills | Status: DC | PRN

## 2021-04-29 NOTE — Progress Notes (Signed)
[facility] [place], [place] [address]  Internal MEDICINE  Office Visit Note  Patient Name: Breanna Flores  [id number]  [id number]  Date of Service: 04/30/2021  Chief Complaint  Patient presents with   Follow-up    Refills    Sleep Apnea   Hyperlipidemia   Hypertension   Quality Metric Gaps    Pneumovax, shingrix, colonoscopy, booster    HPI Pt is here for routine follow up -Wears CPAP nightly -BP at home 135-140/60-70. -Pt has stopped smoking but is still taking the chantix 1 per day and looking to taper off this completely soon. She is afraid of relapse -Does not eat fried foods, eating much healthier -Patient did not get labs done after last visit and will go now-new orders placed  Current Medication: Outpatient Encounter Medications as of 04/29/2021  Medication Sig   amLODipine (NORVASC) 10 MG tablet Take 1 tablet (10 mg total) by mouth daily.   losartan-hydrochlorothiazide (HYZAAR) 100-25 MG tablet Take 1 tablet by mouth daily.   Metoprolol Succinate 100 MG CS24 Take 100 mg by mouth daily.   NON FORMULARY cpap device   simvastatin (ZOCOR) 20 MG tablet TAKE 1 TABLET BY MOUTH ONCE EVERY EVENING   varenicline (CHANTIX) 1 MG tablet TAKE 1 TABLET BY MOUTH TWICE DAILY   [DISCONTINUED] ezetimibe (ZETIA) 10 MG tablet Take 1 tablet (10 mg total) by mouth daily.   [DISCONTINUED] furosemide (LASIX) 20 MG tablet TAKE 1 TABLET BY MOUTH ONCE DAILY AS NEEDED   ezetimibe (ZETIA) 10 MG tablet Take 1 tablet (10 mg total) by mouth daily.   furosemide (LASIX) 20 MG tablet Take 1 tablet (20 mg total) by mouth daily as needed.   No facility-administered encounter medications on file as of 04/29/2021.    Surgical History: Past Surgical History:  Procedure Laterality Date   EYE SURGERY  1963, 1978    Medical History: Past Medical History:  Diagnosis Date   Hyperlipidemia    Hypertension    Sleep apnea     Family History: Family History  Problem  Relation Age of Onset   Dementia Mother    Heart murmur Mother    Hypertension Father    Gout Father    Hyperlipidemia Father     Social History   Socioeconomic History   Marital status: Married    Spouse name: Not on file   Number of children: Not on file   Years of education: Not on file   Highest education level: Not on file  Occupational History   Not on file  Tobacco Use   Smoking status: Former    Pack years: 0.00    Types: Cigarettes    Quit date: 2016    Years since quitting: 6.4   Smokeless tobacco: Never  Substance and Sexual Activity   Alcohol use: Yes    Comment: rarely   Drug use: Never   Sexual activity: Not on file  Other Topics Concern   Not on file  Social History Narrative   Not on file   Social Determinants of Health   Financial Resource Strain: Not on file  Food Insecurity: Not on file  Transportation Needs: Not on file  Physical Activity: Not on file  Stress: Not on file  Social Connections: Not on file  Intimate Partner Violence: Not on file      Review of Systems  Constitutional:  Negative for chills, fatigue and unexpected weight change.  HENT:  Negative for congestion, postnasal drip, rhinorrhea,  sneezing and sore throat.   Eyes:  Negative for redness.  Respiratory:  Negative for cough, chest tightness and shortness of breath.   Cardiovascular:  Negative for chest pain and palpitations.  Gastrointestinal:  Negative for abdominal pain, constipation, diarrhea, nausea and vomiting.  Genitourinary:  Negative for dysuria and frequency.  Musculoskeletal:  Positive for arthralgias. Negative for back pain, joint swelling and neck pain.  Skin:  Negative for rash.  Neurological: Negative.  Negative for tremors and numbness.  Hematological:  Negative for adenopathy. Does not bruise/bleed easily.  Psychiatric/Behavioral:  Negative for behavioral problems (Depression), sleep disturbance and suicidal ideas. The patient is not nervous/anxious.     Vital Signs: BP 139/68   Pulse 60   Temp (!) 97.2 F (36.2 C)   Resp 16   Ht 5\' 9"  (1.753 m)   Wt 240 lb 6.4 oz (109 kg)   SpO2 99%   BMI 35.50 kg/m    Physical Exam Vitals and nursing note reviewed.  Constitutional:      General: She is not in acute distress.    Appearance: She is well-developed. She is obese. She is not diaphoretic.  HENT:     Head: Normocephalic and atraumatic.     Mouth/Throat:     Pharynx: No oropharyngeal exudate.  Eyes:     Pupils: Pupils are equal, round, and reactive to light.  Neck:     Thyroid: No thyromegaly.     Vascular: No JVD.     Trachea: No tracheal deviation.  Cardiovascular:     Rate and Rhythm: Normal rate and regular rhythm.     Heart sounds: Normal heart sounds. No murmur heard.   No friction rub. No gallop.  Pulmonary:     Effort: Pulmonary effort is normal. No respiratory distress.     Breath sounds: No wheezing or rales.  Chest:     Chest wall: No tenderness.  Abdominal:     General: Bowel sounds are normal.     Palpations: Abdomen is soft.  Musculoskeletal:        General: Normal range of motion.     Cervical back: Normal range of motion and neck supple.  Lymphadenopathy:     Cervical: No cervical adenopathy.  Skin:    General: Skin is warm and dry.  Neurological:     Mental Status: She is alert and oriented to person, place, and time.     Cranial Nerves: No cranial nerve deficit.  Psychiatric:        Behavior: Behavior normal.        Thought Content: Thought content normal.        Judgment: Judgment normal.       Assessment/Plan: 1. Essential hypertension Stable, continue current therapy - furosemide (LASIX) 20 MG tablet; Take 1 tablet (20 mg total) by mouth daily as needed.  Dispense: 90 tablet; Refill: 1  2. Mixed hyperlipidemia Continue current therapy and will update labs - ezetimibe (ZETIA) 10 MG tablet; Take 1 tablet (10 mg total) by mouth daily.  Dispense: 30 tablet; Refill: 5 - Lipid Panel With  LDL/HDL Ratio  3. Impaired fasting glucose - POCT HgB A1C is 6.1, patient will continue to work on improving diet and exercise  4. OSA (obstructive sleep apnea) Continue CPAP nightly  5. Vitamin D deficiency - VITAMIN D 25 Hydroxy (Vit-D Deficiency, Fractures)  6. B12 deficiency - B12 and Folate Panel  7. Other fatigue - CBC w/Diff/Platelet - Comprehensive metabolic panel - TSH + free T4  General Counseling: Byanca verbalizes understanding of the findings of todays visit and agrees with plan of treatment. I have discussed any further diagnostic evaluation that may be needed or ordered today. We also reviewed her medications today. she has been encouraged to call the office with any questions or concerns that should arise related to todays visit.    Orders Placed This Encounter  Procedures   CBC w/Diff/Platelet   Comprehensive metabolic panel   TSH + free T4   Lipid Panel With LDL/HDL Ratio   VITAMIN D 25 Hydroxy (Vit-D Deficiency, Fractures)   B12 and Folate Panel   POCT HgB A1C    Meds ordered this encounter  Medications   furosemide (LASIX) 20 MG tablet    Sig: Take 1 tablet (20 mg total) by mouth daily as needed.    Dispense:  90 tablet    Refill:  1   ezetimibe (ZETIA) 10 MG tablet    Sig: Take 1 tablet (10 mg total) by mouth daily.    Dispense:  30 tablet    Refill:  5    This patient was seen by Drema Dallas, PA-C in collaboration with Dr. Clayborn Bigness as a part of collaborative care agreement.   Total time spent:30 Minutes Time spent includes review of chart, medications, test results, and follow up plan with the patient.      Dr Lavera Guise Internal medicine

## 2021-05-01 ENCOUNTER — Ambulatory Visit: Payer: Federal, State, Local not specified - PPO | Admitting: Dermatology

## 2021-05-24 ENCOUNTER — Telehealth: Payer: Self-pay

## 2021-05-24 NOTE — Telephone Encounter (Signed)
Lmom about colonoscopy when she did  and where

## 2021-08-03 ENCOUNTER — Other Ambulatory Visit: Payer: Self-pay | Admitting: Internal Medicine

## 2021-08-03 DIAGNOSIS — I10 Essential (primary) hypertension: Secondary | ICD-10-CM

## 2021-08-29 ENCOUNTER — Ambulatory Visit: Payer: Federal, State, Local not specified - PPO | Admitting: Physician Assistant

## 2021-09-19 ENCOUNTER — Other Ambulatory Visit: Payer: Self-pay | Admitting: Internal Medicine

## 2021-09-19 DIAGNOSIS — E782 Mixed hyperlipidemia: Secondary | ICD-10-CM

## 2021-09-19 DIAGNOSIS — I10 Essential (primary) hypertension: Secondary | ICD-10-CM

## 2021-09-30 ENCOUNTER — Other Ambulatory Visit: Payer: Self-pay | Admitting: Physician Assistant

## 2021-09-30 ENCOUNTER — Other Ambulatory Visit: Payer: Self-pay | Admitting: Internal Medicine

## 2021-09-30 DIAGNOSIS — I10 Essential (primary) hypertension: Secondary | ICD-10-CM

## 2021-10-21 ENCOUNTER — Other Ambulatory Visit: Payer: Self-pay | Admitting: Physician Assistant

## 2021-10-21 DIAGNOSIS — E782 Mixed hyperlipidemia: Secondary | ICD-10-CM

## 2021-11-19 ENCOUNTER — Other Ambulatory Visit: Payer: Self-pay | Admitting: Internal Medicine

## 2021-11-19 DIAGNOSIS — E782 Mixed hyperlipidemia: Secondary | ICD-10-CM

## 2021-12-13 ENCOUNTER — Telehealth: Payer: Self-pay

## 2021-12-13 NOTE — Telephone Encounter (Signed)
Left vm to confirm 12/16/21 appointment-Breanna Flores

## 2021-12-16 ENCOUNTER — Ambulatory Visit (INDEPENDENT_AMBULATORY_CARE_PROVIDER_SITE_OTHER): Payer: Federal, State, Local not specified - PPO | Admitting: Physician Assistant

## 2021-12-16 ENCOUNTER — Encounter: Payer: Self-pay | Admitting: Physician Assistant

## 2021-12-16 ENCOUNTER — Other Ambulatory Visit: Payer: Self-pay

## 2021-12-16 VITALS — BP 166/76 | HR 63 | Temp 98.4°F | Resp 16 | Ht 69.0 in | Wt 226.6 lb

## 2021-12-16 DIAGNOSIS — Z23 Encounter for immunization: Secondary | ICD-10-CM

## 2021-12-16 DIAGNOSIS — Z1212 Encounter for screening for malignant neoplasm of rectum: Secondary | ICD-10-CM

## 2021-12-16 DIAGNOSIS — R300 Dysuria: Secondary | ICD-10-CM

## 2021-12-16 DIAGNOSIS — I10 Essential (primary) hypertension: Secondary | ICD-10-CM | POA: Diagnosis not present

## 2021-12-16 DIAGNOSIS — G4733 Obstructive sleep apnea (adult) (pediatric): Secondary | ICD-10-CM | POA: Diagnosis not present

## 2021-12-16 DIAGNOSIS — Z1231 Encounter for screening mammogram for malignant neoplasm of breast: Secondary | ICD-10-CM

## 2021-12-16 DIAGNOSIS — E559 Vitamin D deficiency, unspecified: Secondary | ICD-10-CM

## 2021-12-16 DIAGNOSIS — R7301 Impaired fasting glucose: Secondary | ICD-10-CM | POA: Diagnosis not present

## 2021-12-16 DIAGNOSIS — E538 Deficiency of other specified B group vitamins: Secondary | ICD-10-CM

## 2021-12-16 DIAGNOSIS — E782 Mixed hyperlipidemia: Secondary | ICD-10-CM

## 2021-12-16 DIAGNOSIS — Z0001 Encounter for general adult medical examination with abnormal findings: Secondary | ICD-10-CM

## 2021-12-16 DIAGNOSIS — R5383 Other fatigue: Secondary | ICD-10-CM

## 2021-12-16 DIAGNOSIS — Z1211 Encounter for screening for malignant neoplasm of colon: Secondary | ICD-10-CM

## 2021-12-16 LAB — POCT GLYCOSYLATED HEMOGLOBIN (HGB A1C): Hemoglobin A1C: 6 % — AB (ref 4.0–5.6)

## 2021-12-16 MED ORDER — ZOSTER VAC RECOMB ADJUVANTED 50 MCG/0.5ML IM SUSR: 0.5000 mL | Freq: Once | INTRAMUSCULAR | 0 refills | Status: AC

## 2021-12-16 NOTE — Progress Notes (Signed)
[facility] [place], [place] [address]  Internal MEDICINE  Office Visit Note  Patient Name: Breanna Flores  [id number]  [id number]  Date of Service: 12/18/2021  Chief Complaint  Patient presents with   Annual Exam   Hyperlipidemia   Hypertension   Quality Metric Gaps    Colonoscopy and Shingles Vaccine     HPI Pt is here for routine health maintenance examination -she is taking losartan-hctz, lasix, zetia in Am, takes amlodipine at night -has been stressed this week due to moving in house with father. And this may contribute to rising BP. Will have her check BP at home and will try extra 1/2 tab amlodipine in Am for during move and stress.  -cpap nightly -chantix stopped, not smoking regularly but will occasionally have one -due for routine labs still and will give new lab slip -due for mammogram and colonoscopy  -shingles vaccine which will be sent to pharmacy  Current Medication: Outpatient Encounter Medications as of 12/16/2021  Medication Sig   amLODipine (NORVASC) 10 MG tablet TAKE 1 TABLET BY MOUTH ONCE DAILY   ezetimibe (ZETIA) 10 MG tablet TAKE 1 TABLET BY MOUTH ONCE DAILY   furosemide (LASIX) 20 MG tablet TAKE 1 TABLET BY MOUTH ONCE DAILY AS NEEDED   losartan-hydrochlorothiazide (HYZAAR) 100-25 MG tablet TAKE 1 TABLET BY MOUTH ONCE DAILY   metoprolol succinate (TOPROL-XL) 100 MG 24 hr tablet TAKE 1 TABLET BY MOUTH ONCE DAILY   NON FORMULARY cpap device   simvastatin (ZOCOR) 20 MG tablet TAKE 1 TABLET BY MOUTH ONCE EVERY EVENING   [DISCONTINUED] varenicline (CHANTIX) 1 MG tablet TAKE 1 TABLET BY MOUTH TWICE DAILY   [DISCONTINUED] Zoster Vaccine Adjuvanted (SHINGRIX) injection Inject 0.5 mLs into the muscle once.   [EXPIRED] Zoster Vaccine Adjuvanted [facility]) injection Inject 0.5 mLs into the muscle once for 1 dose.   No facility-administered encounter medications on file as of 12/16/2021.    Surgical History: Past Surgical History:   Procedure Laterality Date   EYE SURGERY  1963, 1978    Medical History: Past Medical History:  Diagnosis Date   Hyperlipidemia    Hypertension    Sleep apnea     Family History: Family History  Problem Relation Age of Onset   Dementia Mother    Heart murmur Mother    Hypertension Father    Gout Father    Hyperlipidemia Father       Review of Systems  Constitutional:  Negative for chills, fatigue and unexpected weight change.  HENT:  Negative for congestion, postnasal drip, rhinorrhea, sneezing and sore throat.   Eyes:  Negative for redness.  Respiratory:  Negative for cough, chest tightness and shortness of breath.   Cardiovascular:  Negative for chest pain and palpitations.  Gastrointestinal:  Negative for abdominal pain, constipation, diarrhea, nausea and vomiting.  Genitourinary:  Negative for dysuria and frequency.  Musculoskeletal:  Positive for arthralgias. Negative for back pain, joint swelling and neck pain.  Skin:  Negative for rash.  Neurological: Negative.  Negative for tremors and numbness.  Hematological:  Negative for adenopathy. Does not bruise/bleed easily.  Psychiatric/Behavioral:  Negative for behavioral problems (Depression), sleep disturbance and suicidal ideas. The patient is nervous/anxious.     Vital Signs: BP (!) 166/76 Comment: 180/83   Pulse 63    Temp 98.4 F (36.9 C)    Resp 16    Ht 5\' 9"  (1.753 m)    Wt 226 lb 9.6 oz (102.8 kg)  SpO2 99%    BMI 33.46 kg/m    Physical Exam Vitals and nursing note reviewed.  Constitutional:      General: She is not in acute distress.    Appearance: She is well-developed. She is obese. She is not diaphoretic.  HENT:     Head: Normocephalic and atraumatic.     Mouth/Throat:     Pharynx: No oropharyngeal exudate.  Eyes:     Pupils: Pupils are equal, round, and reactive to light.  Neck:     Thyroid: No thyromegaly.     Vascular: No JVD.     Trachea: No tracheal deviation.  Cardiovascular:      Rate and Rhythm: Normal rate and regular rhythm.     Heart sounds: Normal heart sounds. No murmur heard.   No friction rub. No gallop.  Pulmonary:     Effort: Pulmonary effort is normal. No respiratory distress.     Breath sounds: No wheezing or rales.  Chest:     Chest wall: No tenderness.  Abdominal:     General: Bowel sounds are normal.     Palpations: Abdomen is soft.  Musculoskeletal:        General: Normal range of motion.     Cervical back: Normal range of motion and neck supple.  Lymphadenopathy:     Cervical: No cervical adenopathy.  Skin:    General: Skin is warm and dry.  Neurological:     Mental Status: She is alert and oriented to person, place, and time.     Cranial Nerves: No cranial nerve deficit.  Psychiatric:        Behavior: Behavior normal.        Thought Content: Thought content normal.        Judgment: Judgment normal.     LABS: Recent Results (from the past 2160 hour(s))  POCT HgB A1C     Status: Abnormal   Collection Time: 12/16/21  9:35 AM  Result Value Ref Range   Hemoglobin A1C 6.0 (A) 4.0 - 5.6 %   HbA1c POC (<> result, manual entry)     HbA1c, POC (prediabetic range)     HbA1c, POC (controlled diabetic range)    UA/M w/rflx Culture, Routine     Status: None   Collection Time: 12/16/21 10:55 AM   Specimen: Urine   Urine  Result Value Ref Range   Specific Gravity, UA 1.007 1.005 - 1.030   pH, UA 7.0 5.0 - 7.5   Color, UA Yellow Yellow   Appearance Ur Clear Clear   Leukocytes,UA Negative Negative   Protein,UA Negative Negative/Trace   Glucose, UA Negative Negative   Ketones, UA Negative Negative   RBC, UA Negative Negative   Bilirubin, UA Negative Negative   Urobilinogen, Ur 0.2 0.2 - 1.0 mg/dL   Nitrite, UA Negative Negative   Microscopic Examination Comment     Comment: Microscopic follows if indicated.   Microscopic Examination See below:     Comment: Microscopic was indicated and was performed.   Urinalysis Reflex Comment      Comment: This specimen will not reflex to a Urine Culture.  Microscopic Examination     Status: None   Collection Time: 12/16/21 10:55 AM   Urine  Result Value Ref Range   WBC, UA None seen 0 - 5 /hpf   RBC None seen 0 - 2 /hpf   Epithelial Cells (non renal) None seen 0 - 10 /hpf   Casts None seen None seen /lpf  Bacteria, UA None seen None seen/Few        Assessment/Plan: 1. Encounter for general adult medical examination with abnormal findings CPE performed, routine fasting labs ordered, mammogram and colonoscopy ordered  2. Essential hypertension Elevated in office likely due to increased stress this week. Will have her take an extra 1/2 tab amlodipine in AM (5mg  in AM and continued 10mg  at night) during moving stress and monitor closely. If continued elevation alternative med may be sent and pt will notify office  3. OSA (obstructive sleep apnea) Continue cpap nightly  4. Impaired fasting glucose - POCT HgB A1C is 6.0 which is prediabetic, will continue to monitor and have pt work on diet and exercise  5. Visit for screening mammogram - MM 3D SCREEN BREAST BILATERAL; Future  6. Screening for colorectal cancer - Ambulatory referral to Gastroenterology  7. Need for shingles vaccine - Zoster Vaccine Adjuvanted [facility]) injection; Inject 0.5 mLs into the muscle once for 1 dose.  Dispense: 0.5 mL; Refill: 0  8. B12 deficiency - B12 and Folate Panel  9. Vitamin D deficiency - VITAMIN D 25 Hydroxy (Vit-D Deficiency, Fractures)  10. Mixed hyperlipidemia Continue zetia and simvastatin and will update labs - Lipid Panel With LDL/HDL Ratio  11. Other fatigue - TSH + free T4 - Comprehensive metabolic panel - CBC w/Diff/Platelet  12. Dysuria - UA/M w/rflx Culture, Routine   General Counseling: Kalis verbalizes understanding of the findings of todays visit and agrees with plan of treatment. I have discussed any further diagnostic evaluation that may be needed or  ordered today. We also reviewed her medications today. she has been encouraged to call the office with any questions or concerns that should arise related to todays visit.    Counseling:    Orders Placed This Encounter  Procedures   Microscopic Examination   MM 3D SCREEN BREAST BILATERAL   UA/M w/rflx Culture, Routine   B12 and Folate Panel   VITAMIN D 25 Hydroxy (Vit-D Deficiency, Fractures)   Lipid Panel With LDL/HDL Ratio   TSH + free T4   Comprehensive metabolic panel   CBC w/Diff/Platelet   Ambulatory referral to Gastroenterology   POCT HgB A1C    Meds ordered this encounter  Medications   Zoster Vaccine Adjuvanted [facility]) injection    Sig: Inject 0.5 mLs into the muscle once for 1 dose.    Dispense:  0.5 mL    Refill:  0    This patient was seen by Drema Dallas, PA-C in collaboration with Dr. Clayborn Bigness as a part of collaborative care agreement.  Total time spent:35 Minutes  Time spent includes review of chart, medications, test results, and follow up plan with the patient.     Lavera Guise, MD  Internal Medicine

## 2021-12-17 ENCOUNTER — Other Ambulatory Visit: Payer: Self-pay

## 2021-12-17 ENCOUNTER — Encounter: Payer: Federal, State, Local not specified - PPO | Admitting: Nurse Practitioner

## 2021-12-17 DIAGNOSIS — Z1211 Encounter for screening for malignant neoplasm of colon: Secondary | ICD-10-CM

## 2021-12-17 LAB — UA/M W/RFLX CULTURE, ROUTINE
Bilirubin, UA: NEGATIVE
Glucose, UA: NEGATIVE
Ketones, UA: NEGATIVE
Leukocytes,UA: NEGATIVE
Nitrite, UA: NEGATIVE
Protein,UA: NEGATIVE
RBC, UA: NEGATIVE
Specific Gravity, UA: 1.007 (ref 1.005–1.030)
Urobilinogen, Ur: 0.2 mg/dL (ref 0.2–1.0)
pH, UA: 7 (ref 5.0–7.5)

## 2021-12-17 LAB — MICROSCOPIC EXAMINATION
Bacteria, UA: NONE SEEN
Casts: NONE SEEN /lpf
Epithelial Cells (non renal): NONE SEEN /hpf (ref 0–10)
RBC, Urine: NONE SEEN /hpf (ref 0–2)
WBC, UA: NONE SEEN /hpf (ref 0–5)

## 2021-12-17 MED ORDER — PEG 3350-KCL-NA BICARB-NACL 420 G PO SOLR: 4000.0000 mL | Freq: Once | ORAL | 0 refills | Status: DC

## 2021-12-17 NOTE — Progress Notes (Signed)
Gastroenterology Pre-Procedure Review  Request Date: 01/30/2022 Requesting Physician: Dr. Allen Norris  PATIENT REVIEW QUESTIONS: The patient responded to the following health history questions as indicated:    1. Are you having any GI issues? no 2. Do you have a personal history of Polyps?  03/2011- no polyps removed 3. Do you have a family history of Colon Cancer or Polyps? yes (Maternal Grandmother- colon cancer) 4. Diabetes Mellitus?  Pre-diabetes 5. Joint replacements in the past 12 months?no 6. Major health problems in the past 3 months?no 7. Any artificial heart valves, MVP, or defibrillator?no    MEDICATIONS & ALLERGIES:    Patient reports the following regarding taking any anticoagulation/antiplatelet therapy:   Plavix, Coumadin, Eliquis, Xarelto, Lovenox, Pradaxa, Brilinta, or Effient? no Aspirin? yes (81 mg)  Patient confirms/reports the following medications:  Current Outpatient Medications  Medication Sig Dispense Refill   amLODipine (NORVASC) 10 MG tablet TAKE 1 TABLET BY MOUTH ONCE DAILY 90 tablet 1   ezetimibe (ZETIA) 10 MG tablet TAKE 1 TABLET BY MOUTH ONCE DAILY 30 tablet 0   furosemide (LASIX) 20 MG tablet TAKE 1 TABLET BY MOUTH ONCE DAILY AS NEEDED 30 tablet 0   losartan-hydrochlorothiazide (HYZAAR) 100-25 MG tablet TAKE 1 TABLET BY MOUTH ONCE DAILY 90 tablet 1   metoprolol succinate (TOPROL-XL) 100 MG 24 hr tablet TAKE 1 TABLET BY MOUTH ONCE DAILY 30 tablet 0   NON FORMULARY cpap device     simvastatin (ZOCOR) 20 MG tablet TAKE 1 TABLET BY MOUTH ONCE EVERY EVENING 30 tablet 0   varenicline (CHANTIX) 1 MG tablet TAKE 1 TABLET BY MOUTH TWICE DAILY 56 tablet 0   No current facility-administered medications for this visit.    Patient confirms/reports the following allergies:  Allergies  Allergen Reactions   Erythromycin Rash   Penicillins Rash   Tape Rash    Specifically ACE wrap     No orders of the defined types were placed in this encounter.   AUTHORIZATION  INFORMATION Primary Insurance: 1D#: Group #:  Secondary Insurance: 1D#: Group #:  SCHEDULE INFORMATION: Date: 01/30/2022 Time: Location: [facility]

## 2022-01-01 ENCOUNTER — Ambulatory Visit: Payer: Federal, State, Local not specified - PPO | Admitting: Internal Medicine

## 2022-01-04 ENCOUNTER — Other Ambulatory Visit: Payer: Self-pay | Admitting: Physician Assistant

## 2022-01-04 DIAGNOSIS — I10 Essential (primary) hypertension: Secondary | ICD-10-CM

## 2022-01-10 ENCOUNTER — Ambulatory Visit: Payer: Federal, State, Local not specified - PPO | Admitting: Physician Assistant

## 2022-01-13 ENCOUNTER — Encounter: Payer: Self-pay | Admitting: Internal Medicine

## 2022-01-13 ENCOUNTER — Ambulatory Visit: Payer: Federal, State, Local not specified - PPO | Admitting: Internal Medicine

## 2022-01-13 ENCOUNTER — Other Ambulatory Visit: Payer: Self-pay

## 2022-01-13 VITALS — BP 136/80 | HR 69 | Temp 98.1°F | Resp 16 | Ht 69.0 in | Wt 224.4 lb

## 2022-01-13 DIAGNOSIS — G4733 Obstructive sleep apnea (adult) (pediatric): Secondary | ICD-10-CM | POA: Diagnosis not present

## 2022-01-13 DIAGNOSIS — Z7189 Other specified counseling: Secondary | ICD-10-CM

## 2022-01-13 NOTE — Patient Instructions (Signed)
Sleep Apnea ?Sleep apnea affects breathing during sleep. It causes breathing to stop for 10 seconds or more, or to become shallow. People with sleep apnea usually snore loudly. ?It can also increase the risk of: ?Heart attack. ?Stroke. ?Being very overweight (obese). ?Diabetes. ?Heart failure. ?Irregular heartbeat. ?High blood pressure. ?The goal of treatment is to help you breathe normally again. ?What are the causes? ?The most common cause of this condition is a collapsed or blocked airway. ?There are three kinds of sleep apnea: ?Obstructive sleep apnea. This is caused by a blocked or collapsed airway. ?Central sleep apnea. This happens when the brain does not send the right signals to the muscles that control breathing. ?Mixed sleep apnea. This is a combination of obstructive and central sleep apnea. ?What increases the risk? ?Being overweight. ?Smoking. ?Having a small airway. ?Being older. ?Being female. ?Drinking alcohol. ?Taking medicines to calm yourself (sedatives or tranquilizers). ?Having family members with the condition. ?Having a tongue or tonsils that are larger than normal. ?What are the signs or symptoms? ?Trouble staying asleep. ?Loud snoring. ?Headaches in the morning. ?Waking up gasping. ?Dry mouth or sore throat in the morning. ?Being sleepy or tired during the day. ?If you are sleepy or tired during the day, you may also: ?Not be able to focus your mind (concentrate). ?Forget things. ?Get angry a lot and have mood swings. ?Feel sad (depressed). ?Have changes in your personality. ?Have less interest in sex, if you are female. ?Be unable to have an erection, if you are female. ?How is this treated? ? ?Sleeping on your side. ?Using a medicine to get rid of mucus in your nose (decongestant). ?Avoiding the use of alcohol, medicines to help you relax, or certain pain medicines (narcotics). ?Losing weight, if needed. ?Changing your diet. ?Quitting smoking. ?Using a machine to open your airway while you  sleep, such as: ?An oral appliance. This is a mouthpiece that shifts your lower jaw forward. ?A CPAP device. This device blows air through a mask when you breathe out (exhale). ?An EPAP device. This has valves that you put in each nostril. ?A BIPAP device. This device blows air through a mask when you breathe in (inhale) and breathe out. ?Having surgery if other treatments do not work. ?Follow these instructions at home: ?Lifestyle ?Make changes that your doctor recommends. ?Eat a healthy diet. ?Lose weight if needed. ?Avoid alcohol, medicines to help you relax, and some pain medicines. ?Do not smoke or use any products that contain nicotine or tobacco. If you need help quitting, ask your doctor. ?General instructions ?Take over-the-counter and prescription medicines only as told by your doctor. ?If you were given a machine to use while you sleep, use it only as told by your doctor. ?If you are having surgery, make sure to tell your doctor you have sleep apnea. You may need to bring your device with you. ?Keep all follow-up visits. ?Contact a doctor if: ?The machine that you were given to use during sleep bothers you or does not seem to be working. ?You do not get better. ?You get worse. ?Get help right away if: ?Your chest hurts. ?You have trouble breathing in enough air. ?You have an uncomfortable feeling in your back, arms, or stomach. ?You have trouble talking. ?One side of your body feels weak. ?A part of your face is hanging down. ?These symptoms may be an emergency. Get help right away. Call your local emergency services (911 in the [place] ?Do not wait to see if the symptoms   will go away. ?Do not drive yourself to the hospital. ?Summary ?This condition affects breathing during sleep. ?The most common cause is a collapsed or blocked airway. ?The goal of treatment is to help you breathe normally while you sleep. ?This information is not intended to replace advice given to you by your health care provider. Make  sure you discuss any questions you have with your health care provider. ?Document Revised: 06/05/2021 Document Reviewed: 10/05/2020 ?Elsevier Patient Education ? 2022 [employer] ? ?

## 2022-01-13 NOTE — Progress Notes (Signed)
[facility] [place],  [address]  Pulmonary Sleep Medicine   Office Visit Note  Patient Name: Breanna Flores DOB: 01-24-1961 MRN [id number]  Date of Service: 01/13/2022  Complaints/HPI: OSA she is using her CPAP as prescribed feels great in the morning. Patient has lost some weight. She may need to be reassessed to see if there is a change in her pressures needed. She is not smoking and has been off since her last visit.   ROS  General: (-) fever, (-) chills, (-) night sweats, (-) weakness Skin: (-) rashes, (-) itching,. Eyes: (-) visual changes, (-) redness, (-) itching. Nose and Sinuses: (-) nasal stuffiness or itchiness, (-) postnasal drip, (-) nosebleeds, (-) sinus trouble. Mouth and Throat: (-) sore throat, (-) hoarseness. Neck: (-) swollen glands, (-) enlarged thyroid, (-) neck pain. Respiratory: - cough, (-) bloody sputum, + shortness of breath, - wheezing. Cardiovascular: - ankle swelling, (-) chest pain. Lymphatic: (-) lymph node enlargement. Neurologic: (-) numbness, (-) tingling. Psychiatric: (-) anxiety, (-) depression   Current Medication: Outpatient Encounter Medications as of 01/13/2022  Medication Sig   amLODipine (NORVASC) 10 MG tablet TAKE 1 TABLET BY MOUTH ONCE DAILY   ezetimibe (ZETIA) 10 MG tablet TAKE 1 TABLET BY MOUTH ONCE DAILY   furosemide (LASIX) 20 MG tablet TAKE 1 TABLET BY MOUTH ONCE DAILY AS NEEDED   losartan-hydrochlorothiazide (HYZAAR) 100-25 MG tablet TAKE 1 TABLET BY MOUTH ONCE DAILY   metoprolol succinate (TOPROL-XL) 100 MG 24 hr tablet TAKE 1 TABLET BY MOUTH ONCE DAILY   NON FORMULARY cpap device   simvastatin (ZOCOR) 20 MG tablet TAKE 1 TABLET BY MOUTH ONCE EVERY EVENING   No facility-administered encounter medications on file as of 01/13/2022.    Surgical History: Past Surgical History:  Procedure Laterality Date   EYE SURGERY  1963, 1978    Medical History: Past Medical History:  Diagnosis Date    Hyperlipidemia    Hypertension    Sleep apnea     Family History: Family History  Problem Relation Age of Onset   Dementia Mother    Heart murmur Mother    Hypertension Father    Gout Father    Hyperlipidemia Father     Social History: Social History   Socioeconomic History   Marital status: Married    Spouse name: Not on file   Number of children: Not on file   Years of education: Not on file   Highest education level: Not on file  Occupational History   Not on file  Tobacco Use   Smoking status: Former    Types: Cigarettes    Quit date: 2016    Years since quitting: 7.1   Smokeless tobacco: Never  Substance and Sexual Activity   Alcohol use: Yes    Comment: rarely   Drug use: Never   Sexual activity: Not on file  Other Topics Concern   Not on file  Social History Narrative   Not on file   Social Determinants of Health   Financial Resource Strain: Not on file  Food Insecurity: Not on file  Transportation Needs: Not on file  Physical Activity: Not on file  Stress: Not on file  Social Connections: Not on file  Intimate Partner Violence: Not on file    Vital Signs: Blood pressure 136/80, pulse 69, temperature 98.1 F (36.7 C), resp. rate 16, height '5\' 9"'$  (1.753 m), weight 224 lb 6.4 oz (101.8 kg), SpO2 100 %.  Examination: General Appearance: The  patient is well-developed, well-nourished, and in no distress. Skin: Gross inspection of skin unremarkable. Head: normocephalic, no gross deformities. Eyes: no gross deformities noted. ENT: ears appear grossly normal no exudates. Neck: Supple. No thyromegaly. No LAD. Respiratory: no rhonchi noted. Cardiovascular: Normal S1 and S2 without murmur or rub. Extremities: No cyanosis. pulses are equal. Neurologic: Alert and oriented. No involuntary movements.  LABS: Recent Results (from the past 2160 hour(s))  POCT HgB A1C     Status: Abnormal   Collection Time: 12/16/21  9:35 AM  Result Value Ref Range    Hemoglobin A1C 6.0 (A) 4.0 - 5.6 %   HbA1c POC (<> result, manual entry)     HbA1c, POC (prediabetic range)     HbA1c, POC (controlled diabetic range)    UA/M w/rflx Culture, Routine     Status: None   Collection Time: 12/16/21 10:55 AM   Specimen: Urine   Urine  Result Value Ref Range   Specific Gravity, UA 1.007 1.005 - 1.030   pH, UA 7.0 5.0 - 7.5   Color, UA Yellow Yellow   Appearance Ur Clear Clear   Leukocytes,UA Negative Negative   Protein,UA Negative Negative/Trace   Glucose, UA Negative Negative   Ketones, UA Negative Negative   RBC, UA Negative Negative   Bilirubin, UA Negative Negative   Urobilinogen, Ur 0.2 0.2 - 1.0 mg/dL   Nitrite, UA Negative Negative   Microscopic Examination Comment     Comment: Microscopic follows if indicated.   Microscopic Examination See below:     Comment: Microscopic was indicated and was performed.   Urinalysis Reflex Comment     Comment: This specimen will not reflex to a Urine Culture.  Microscopic Examination     Status: None   Collection Time: 12/16/21 10:55 AM   Urine  Result Value Ref Range   WBC, UA None seen 0 - 5 /hpf   RBC None seen 0 - 2 /hpf   Epithelial Cells (non renal) None seen 0 - 10 /hpf   Casts None seen None seen /lpf   Bacteria, UA None seen None seen/Few    Radiology: MM 3D SCREEN BREAST BILATERAL  Result Date: 10/30/2020 CLINICAL DATA:  Screening. EXAM: DIGITAL SCREENING BILATERAL MAMMOGRAM WITH TOMO AND CAD COMPARISON:  Previous exam(s). ACR Breast Density Category b: There are scattered areas of fibroglandular density. FINDINGS: There are no findings suspicious for malignancy. Images were processed with CAD. IMPRESSION: No mammographic evidence of malignancy. A result letter of this screening mammogram will be mailed directly to the patient. RECOMMENDATION: Screening mammogram in one year. (Code:SM-B-01Y) BI-RADS CATEGORY  1: Negative. Electronically Signed   By: Claudie Revering M.D.   On: 10/30/2020 13:09     No results found.  No results found.    Assessment and Plan: Patient Active Problem List   Diagnosis Date Noted   Gastroenteritis 07/01/2019   Obstructive chronic bronchitis without exacerbation ([place]) 07/01/2019   Screening for breast cancer 12/13/2018   Cigarette smoker 12/13/2018   Dysuria 12/13/2018   Exposure to sexually transmitted disease (STD) 12/13/2018   Sleep apnea 03/18/2018   Essential hypertension 03/18/2018   Hyperlipidemia 03/18/2018   1. OSA (obstructive sleep apnea) Doing well with CPAP. She has stated that notes a lot of benefit  2. CPAP use counseling CPAP Counseling: had a lengthy discussion with the patient regarding the importance of PAP therapy in management of the sleep apnea. Patient appears to understand the risk factor reduction and also understands the risks  associated with untreated sleep apnea. Patient will try to make a good faith effort to remain compliant with therapy. Also instructed the patient on proper cleaning of the device including the water must be changed daily if possible and use of distilled water is preferred. Patient understands that the machine should be regularly cleaned with appropriate recommended cleaning solutions that do not damage the PAP machine for example given white vinegar and water rinses. Other methods such as ozone treatment may not be as good as these simple methods to achieve cleaning.   3. Obesity, morbid ([place]) Obesity Counseling: Had a lengthy discussion regarding patients BMI and weight issues. Patient was instructed on portion control as well as increased activity. Also discussed caloric restrictions with trying to maintain intake less than 2000 Kcal. Discussions were made in accordance with the 5As of weight management. Simple actions such as not eating late and if able to, taking a walk is suggested.     General Counseling: I have discussed the findings of the evaluation and examination with [name].  I have also  discussed any further diagnostic evaluation thatmay be needed or ordered today. Abrial verbalizes understanding of the findings of todays visit. We also reviewed her medications today and discussed drug interactions and side effects including but not limited excessive drowsiness and altered mental states. We also discussed that there is always a risk not just to her but also people around her. she has been encouraged to call the office with any questions or concerns that should arise related to todays visit.  No orders of the defined types were placed in this encounter.    Time spent: 28  I have personally obtained a history, examined the patient, evaluated laboratory and imaging results, formulated the assessment and plan and placed orders.    Allyne Gee, MD [facility] Pulmonary and Critical Care Sleep medicine

## 2022-01-20 ENCOUNTER — Ambulatory Visit: Payer: Federal, State, Local not specified - PPO | Admitting: Internal Medicine

## 2022-01-24 ENCOUNTER — Other Ambulatory Visit: Payer: Self-pay | Admitting: Physician Assistant

## 2022-01-24 DIAGNOSIS — E782 Mixed hyperlipidemia: Secondary | ICD-10-CM

## 2022-01-27 ENCOUNTER — Ambulatory Visit: Payer: Federal, State, Local not specified - PPO | Admitting: Physician Assistant

## 2022-01-27 ENCOUNTER — Other Ambulatory Visit: Payer: Self-pay | Admitting: Gastroenterology

## 2022-01-29 ENCOUNTER — Encounter: Payer: Self-pay | Admitting: Gastroenterology

## 2022-01-30 ENCOUNTER — Encounter
Admission: RE | Disposition: A | Discharge status: Home / Self Care | Payer: Self-pay | Source: Home / Self Care | Attending: Gastroenterology

## 2022-01-30 ENCOUNTER — Ambulatory Visit: Payer: Federal, State, Local not specified - PPO | Admitting: Anesthesiology

## 2022-01-30 ENCOUNTER — Encounter: Payer: Self-pay | Admitting: Gastroenterology

## 2022-01-30 ENCOUNTER — Ambulatory Visit
Admission: RE | Admit: 2022-01-30 | Discharge: 2022-01-30 | Disposition: A | Discharge status: Home / Self Care | Payer: Federal, State, Local not specified - PPO | Attending: Gastroenterology | Admitting: Gastroenterology

## 2022-01-30 DIAGNOSIS — E785 Hyperlipidemia, unspecified: Secondary | ICD-10-CM | POA: Diagnosis not present

## 2022-01-30 DIAGNOSIS — Z79899 Other long term (current) drug therapy: Secondary | ICD-10-CM | POA: Diagnosis not present

## 2022-01-30 DIAGNOSIS — I10 Essential (primary) hypertension: Secondary | ICD-10-CM | POA: Insufficient documentation

## 2022-01-30 DIAGNOSIS — Z1211 Encounter for screening for malignant neoplasm of colon: Secondary | ICD-10-CM | POA: Insufficient documentation

## 2022-01-30 DIAGNOSIS — G4730 Sleep apnea, unspecified: Secondary | ICD-10-CM | POA: Insufficient documentation

## 2022-01-30 DIAGNOSIS — K640 First degree hemorrhoids: Secondary | ICD-10-CM | POA: Insufficient documentation

## 2022-01-30 DIAGNOSIS — K635 Polyp of colon: Secondary | ICD-10-CM | POA: Diagnosis not present

## 2022-01-30 DIAGNOSIS — Z87891 Personal history of nicotine dependence: Secondary | ICD-10-CM | POA: Insufficient documentation

## 2022-01-30 DIAGNOSIS — K5730 Diverticulosis of large intestine without perforation or abscess without bleeding: Secondary | ICD-10-CM | POA: Insufficient documentation

## 2022-01-30 HISTORY — PX: COLONOSCOPY WITH PROPOFOL: SHX5780

## 2022-01-30 SURGERY — COLONOSCOPY WITH PROPOFOL
Anesthesia: General

## 2022-01-30 MED ORDER — PROPOFOL 500 MG/50ML IV EMUL
INTRAVENOUS | Status: AC
  Filled 2022-01-30: qty 150

## 2022-01-30 MED ORDER — LIDOCAINE HCL (PF) 2 % IJ SOLN
INTRAMUSCULAR | Status: AC
  Filled 2022-01-30: qty 5

## 2022-01-30 MED ORDER — GLYCOPYRROLATE 0.2 MG/ML IJ SOLN
INTRAMUSCULAR | Status: AC
  Filled 2022-01-30: qty 1

## 2022-01-30 MED ORDER — SODIUM CHLORIDE 0.9 % IV SOLN
INTRAVENOUS | Status: DC
  Administered 2022-01-30: 1000 mL via INTRAVENOUS

## 2022-01-30 MED ORDER — LIDOCAINE 2% (20 MG/ML) 5 ML SYRINGE
INTRAMUSCULAR | Status: DC | PRN
Start: 2022-01-30 — End: 2022-01-30
  Administered 2022-01-30: 20 mg via INTRAVENOUS

## 2022-01-30 MED ORDER — PROPOFOL 500 MG/50ML IV EMUL
INTRAVENOUS | Status: DC | PRN
Start: 2022-01-30 — End: 2022-01-30
  Administered 2022-01-30: 120 ug/kg/min via INTRAVENOUS

## 2022-01-30 MED ORDER — PROPOFOL 10 MG/ML IV BOLUS
INTRAVENOUS | Status: DC | PRN
  Administered 2022-01-30: 30 mg via INTRAVENOUS
  Administered 2022-01-30: 70 mg via INTRAVENOUS

## 2022-01-30 MED ORDER — GLYCOPYRROLATE 0.2 MG/ML IJ SOLN
INTRAMUSCULAR | Status: DC | PRN
  Administered 2022-01-30: .2 mg via INTRAVENOUS

## 2022-01-30 NOTE — Anesthesia Postprocedure Evaluation (Signed)
Anesthesia Post Note ? ?Patient: Treana Lacour ? ?Procedure(s) Performed: COLONOSCOPY WITH PROPOFOL ? ?Patient location during evaluation: Endoscopy ?Anesthesia Type: General ?Level of consciousness: awake and alert ?Pain management: pain level controlled ?Vital Signs Assessment: post-procedure vital signs reviewed and stable ?Respiratory status: spontaneous breathing, nonlabored ventilation, respiratory function stable and patient connected to nasal cannula oxygen ?Cardiovascular status: blood pressure returned to baseline and stable ?Postop Assessment: no apparent nausea or vomiting ?Anesthetic complications: no ? ? ?No notable events documented. ? ? ?Last Vitals:  ?Vitals:  ? 01/30/22 0804 01/30/22 0814  ?BP: 139/70 (!) 119/57  ?Pulse: (!) 58 64  ?Resp: 15 19  ?Temp:    ?SpO2: 99% 98%  ?  ?Last Pain:  ?Vitals:  ? 01/30/22 0814  ?TempSrc:   ?PainSc: 0-No pain  ? ? ?  ?  ?  ?  ?  ?  ? ?Arita Miss ? ? ? ? ?

## 2022-01-30 NOTE — Op Note (Signed)
[facility] ?Gastroenterology ?Patient Name: Breanna Flores ?Procedure Date: 01/30/2022 6:57 AM ?MRN: [id number] ?Account #: [id number] ?Date of Birth: March 08, 1961 ?Admit Type: Outpatient ?Age: 61 ?Room: [facility] ENDO ROOM 4 ?Gender: Female ?Note Status: Finalized ?Instrument Name: Colonscope [id number] ?Procedure:             Colonoscopy ?Indications:           Screening for colorectal malignant neoplasm ?Providers:             Lucilla Lame MD, MD ?Referring MD:          Drema Dallas ?Medicines:             Propofol per Anesthesia ?Complications:         No immediate complications. ?Procedure:             Pre-Anesthesia Assessment: ?                       - Prior to the procedure, a History and Physical was  ?                       performed, and patient medications and allergies were  ?                       reviewed. The patient's tolerance of previous  ?                       anesthesia was also reviewed. The risks and benefits  ?                       of the procedure and the sedation options and risks  ?                       were discussed with the patient. All questions were  ?                       answered, and informed consent was obtained. Prior  ?                       Anticoagulants: The patient has taken no previous  ?                       anticoagulant or antiplatelet agents. ASA Grade  ?                       Assessment: II - A patient with mild systemic disease.  ?                       After reviewing the risks and benefits, the patient  ?                       was deemed in satisfactory condition to undergo the  ?                       procedure. ?                       After obtaining informed consent, the colonoscope was  ?  passed under direct vision. Throughout the procedure,  ?                       the patient's blood pressure, pulse, and oxygen  ?                       saturations were monitored continuously. The  ?                       Colonoscope was  introduced through the anus and  ?                       advanced to the the cecum, identified by appendiceal  ?                       orifice and ileocecal valve. The colonoscopy was  ?                       performed without difficulty. The patient tolerated  ?                       the procedure well. The quality of the bowel  ?                       preparation was excellent. ?Findings: ?     The perianal and digital rectal examinations were normal. ?     A 4 mm polyp was found in the descending colon. The polyp was sessile.  ?     The polyp was removed with a cold biopsy forceps. Resection and  ?     retrieval were complete. ?     Multiple small-mouthed diverticula were found in the sigmoid colon. ?     Non-bleeding internal hemorrhoids were found during retroflexion. The  ?     hemorrhoids were Grade I (internal hemorrhoids that do not prolapse). ?Impression:            - One 4 mm polyp in the descending colon, removed with  ?                       a cold biopsy forceps. Resected and retrieved. ?                       - Diverticulosis in the sigmoid colon. ?                       - Non-bleeding internal hemorrhoids. ?Recommendation:        - Discharge patient to home. ?                       - Resume previous diet. ?                       - Continue present medications. ?                       - Await pathology results. ?                       - If the pathology report reveals adenomatous tissue,  ?  then repeat the colonoscopy for surveillance in 7  ?                       years otherwise 10 years. ?Procedure Code(s):     --- Professional --- ?                       [phone], Colonoscopy, flexible; with biopsy, single or  ?                       multiple ?Diagnosis Code(s):     --- Professional --- ?                       Z12.11, Encounter for screening for malignant neoplasm  ?                       of colon ?                       K63.5, Polyp of colon ?CPT copyright 2019 American Medical  Association. All rights reserved. ?The codes documented in this report are preliminary and upon coder review may  ?be revised to meet current compliance requirements. ?Lucilla Lame MD, MD ?01/30/2022 7:51:52 AM ?This report has been signed electronically. ?Number of Addenda: 0 ?Note Initiated On: 01/30/2022 6:57 AM ?Scope Withdrawal Time: 0 hours 7 minutes 30 seconds  ?Total Procedure Duration: 0 hours 12 minutes 15 seconds  ?Estimated Blood Loss:  Estimated blood loss: none. ?     [facility] ?

## 2022-01-30 NOTE — H&P (Signed)
? ?Lucilla Lame, MD [facility] ?[address] ?[place],  [address] ?Phone: [phone] ?Fax : [phone] ? ?Primary Care Physician:  Mylinda Latina, PA-C ?Primary Gastroenterologist:  Dr. Allen Norris ? ?Pre-Procedure History & Physical: ?HPI:  Breanna Flores is a 61 y.o. female is here for a screening colonoscopy.  ? ?Past Medical History:  ?Diagnosis Date  ? Hyperlipidemia   ? Hypertension   ? Sleep apnea   ? ? ?Past Surgical History:  ?Procedure Laterality Date  ? [place]  ? ? ?Prior to Admission medications   ?Medication Sig Start Date End Date Taking? Authorizing Provider  ?amLODipine (NORVASC) 10 MG tablet TAKE 1 TABLET BY MOUTH ONCE DAILY 08/05/21  Yes Lavera Guise, MD  ?ezetimibe (ZETIA) 10 MG tablet TAKE 1 TABLET BY MOUTH ONCE DAILY 01/24/22  Yes McDonough, Lauren K, PA-C  ?furosemide (LASIX) 20 MG tablet TAKE 1 TABLET BY MOUTH ONCE DAILY AS NEEDED 01/05/22  Yes McDonough, Si Gaul, PA-C  ?losartan-hydrochlorothiazide (HYZAAR) 100-25 MG tablet TAKE 1 TABLET BY MOUTH ONCE DAILY 09/19/21  Yes Lavera Guise, MD  ?metoprolol succinate (TOPROL-XL) 100 MG 24 hr tablet TAKE 1 TABLET BY MOUTH ONCE DAILY 01/05/22  Yes McDonough, Lauren K, PA-C  ?NON FORMULARY cpap device   Yes [provider]  ?polyethylene glycol-electrolytes (NULYTELY) 420 g solution 1st half of the bowel prep agent an 8 oz glass every 15-20 minutes until completed. Drink the 2nd half of the bowel prep agent, starting 5 hours before your procedure. Drink an 8 oz glass every 15-20 minutes until completed 01/27/22  Yes Adalbert Alberto, MD  ?simvastatin (ZOCOR) 20 MG tablet TAKE 1 TABLET BY MOUTH ONCE EVERY EVENING 11/19/21  Yes McDonough, Si Gaul, PA-C  ? ? ?Allergies as of 12/17/2021 - Review Complete 12/16/2021  ?Allergen Reaction Noted  ? Erythromycin Rash 05/03/2014  ? Penicillins Rash 05/03/2014  ? Tape Rash 05/03/2014  ? ? ?Family History  ?Problem Relation Age of Onset  ? Dementia Mother   ? Heart murmur Mother   ?  Hypertension Father   ? Gout Father   ? Hyperlipidemia Father   ? ? ?Social History  ? ?Socioeconomic History  ? Marital status: Divorced  ?  Spouse name: Not on file  ? Number of children: Not on file  ? Years of education: Not on file  ? Highest education level: Not on file  ?Occupational History  ? Not on file  ?Tobacco Use  ? Smoking status: Former  ?  Types: Cigarettes  ?  Quit date: 2016  ?  Years since quitting: 7.2  ? Smokeless tobacco: Never  ?Substance and Sexual Activity  ? Alcohol use: Yes  ?  Comment: rarely  ? Drug use: Never  ? Sexual activity: Not on file  ?Other Topics Concern  ? Not on file  ?Social History Narrative  ? Not on file  ? ?Social Determinants of Health  ? ?Financial Resource Strain: Not on file  ?Food Insecurity: Not on file  ?Transportation Needs: Not on file  ?Physical Activity: Not on file  ?Stress: Not on file  ?Social Connections: Not on file  ?Intimate Partner Violence: Not on file  ? ? ?Review of Systems: ?See HPI, otherwise negative ROS ? ?Physical Exam: ?BP (!) 167/54   Pulse 62   Temp (!) 96.8 ?F (36 ?C) (Temporal)   Resp 16   Ht '5\' 9"'$  (1.753 m)   Wt 104.5 kg   SpO2 99%   BMI  34.03 kg/m?  ?General:   Alert,  pleasant and cooperative in NAD ?Head:  Normocephalic and atraumatic. ?Neck:  Supple; no masses or thyromegaly. ?Lungs:  Clear throughout to auscultation.    ?Heart:  Regular rate and rhythm. ?Abdomen:  Soft, nontender and nondistended. Normal bowel sounds, without guarding, and without rebound.   ?Neurologic:  Alert and  oriented x4;  grossly normal neurologically. ? ?Impression/Plan: ?Breanna Flores is now here to undergo a screening colonoscopy. ? ?Risks, benefits, and alternatives regarding colonoscopy have been reviewed with the patient.  Questions have been answered.  All parties agreeable. ?

## 2022-01-30 NOTE — Transfer of Care (Signed)
Immediate Anesthesia Transfer of Care Note ? ?Patient: Breanna Flores ? ?Procedure(s) Performed: COLONOSCOPY WITH PROPOFOL ? ?Patient Location: Endoscopy Unit ? ?Anesthesia Type:General ? ?Level of Consciousness: drowsy ? ?Airway & Oxygen Therapy: Patient Spontanous Breathing ? ?Post-op Assessment: Report given to RN and Post -op Vital signs reviewed and stable ? ?Post vital signs: Reviewed ? ?Last Vitals:  ?Vitals Value Taken Time  ?BP    ?Temp    ?Pulse    ?Resp    ?SpO2    ? ? ?Last Pain:  ?Vitals:  ? 01/30/22 0721  ?TempSrc: Temporal  ?   ? ?  ? ?Complications: No notable events documented. ?

## 2022-01-30 NOTE — Anesthesia Preprocedure Evaluation (Signed)
Anesthesia Evaluation  ?Patient identified by MRN, date of birth, ID band ?Patient awake ? ? ? ?Reviewed: ?Allergy & Precautions, NPO status , Patient's Chart, lab work & pertinent test results ? ?History of Anesthesia Complications ?Negative for: history of anesthetic complications ? ?Airway ?Mallampati: III ? ?TM Distance: >3 FB ?Neck ROM: Full ? ? ? Dental ?no notable dental hx. ?(+) Teeth Intact ?  ?Pulmonary ?sleep apnea and Continuous Positive Airway Pressure Ventilation , neg COPD, Patient abstained from smoking.Not current smoker, former smoker,  ?  ?Pulmonary exam normal ?breath sounds clear to auscultation ? ? ? ? ? ? Cardiovascular ?Exercise Tolerance: Good ?METShypertension, Pt. on medications ?(-) CAD and (-) Past MI (-) dysrhythmias  ?Rhythm:Regular Rate:Normal ?- Systolic murmurs ? ?  ?Neuro/Psych ?negative neurological ROS ? negative psych ROS  ? GI/Hepatic ?neg GERD  ,(+)  ?  ? (-) substance abuse ? ,   ?Endo/Other  ?neg diabetes ? Renal/GU ?negative Renal ROS  ? ?  ?Musculoskeletal ? ? Abdominal ?  ?Peds ? Hematology ?  ?Anesthesia Other Findings ?Past Medical History: ?No date: Hyperlipidemia ?No date: Hypertension ?No date: Sleep apnea ? Reproductive/Obstetrics ? ?  ? ? ? ? ? ? ? ? ? ? ? ? ? ?  ?  ? ? ? ? ? ? ? ? ?Anesthesia Physical ?Anesthesia Plan ? ?ASA: 2 ? ?Anesthesia Plan: General  ? ?Post-op Pain Management: Minimal or no pain anticipated  ? ?Induction: Intravenous ? ?PONV Risk Score and Plan: 3 and Propofol infusion, TIVA and Ondansetron ? ?Airway Management Planned: Nasal Cannula ? ?Additional Equipment: None ? ?Intra-op Plan:  ? ?Post-operative Plan:  ? ?Informed Consent: I have reviewed the patients History and Physical, chart, labs and discussed the procedure including the risks, benefits and alternatives for the proposed anesthesia with the patient or authorized representative who has indicated his/her understanding and acceptance.  ? ? ? ?Dental  advisory given ? ?Plan Discussed with: CRNA and Surgeon ? ?Anesthesia Plan Comments: (Discussed risks of anesthesia with patient, including possibility of difficulty with spontaneous ventilation under anesthesia necessitating airway intervention, PONV, and rare risks such as cardiac or respiratory or neurological events, and allergic reactions. Discussed the role of CRNA in patient's perioperative care. Patient understands.)  ? ? ? ? ? ? ?Anesthesia Quick Evaluation ? ?

## 2022-01-31 ENCOUNTER — Encounter: Payer: Self-pay | Admitting: Gastroenterology

## 2022-01-31 LAB — SURGICAL PATHOLOGY

## 2022-02-02 ENCOUNTER — Encounter: Payer: Self-pay | Admitting: Gastroenterology

## 2022-02-02 ENCOUNTER — Other Ambulatory Visit: Payer: Self-pay | Admitting: Internal Medicine

## 2022-02-02 DIAGNOSIS — I10 Essential (primary) hypertension: Secondary | ICD-10-CM

## 2022-02-13 ENCOUNTER — Ambulatory Visit: Payer: Federal, State, Local not specified - PPO | Admitting: Physician Assistant

## 2022-02-27 ENCOUNTER — Other Ambulatory Visit
Admission: RE | Admit: 2022-02-27 | Discharge: 2022-02-27 | Disposition: A | Discharge status: Home / Self Care | Payer: Federal, State, Local not specified - PPO | Attending: Physician Assistant | Admitting: Physician Assistant

## 2022-02-27 DIAGNOSIS — E538 Deficiency of other specified B group vitamins: Secondary | ICD-10-CM | POA: Insufficient documentation

## 2022-02-27 DIAGNOSIS — E559 Vitamin D deficiency, unspecified: Secondary | ICD-10-CM | POA: Insufficient documentation

## 2022-02-27 DIAGNOSIS — R5383 Other fatigue: Secondary | ICD-10-CM | POA: Insufficient documentation

## 2022-02-27 DIAGNOSIS — E782 Mixed hyperlipidemia: Secondary | ICD-10-CM | POA: Diagnosis not present

## 2022-02-27 LAB — CBC WITH DIFFERENTIAL/PLATELET
Abs Immature Granulocytes: 0.02 10*3/uL (ref 0.00–0.07)
Basophils Absolute: 0 10*3/uL (ref 0.0–0.1)
Basophils Relative: 0 %
Eosinophils Absolute: 0.3 10*3/uL (ref 0.0–0.5)
Eosinophils Relative: 3 %
HCT: 47.8 % — ABNORMAL HIGH (ref 36.0–46.0)
Hemoglobin: 15.3 g/dL — ABNORMAL HIGH (ref 12.0–15.0)
Immature Granulocytes: 0 %
Lymphocytes Relative: 28 %
Lymphs Abs: 2.3 10*3/uL (ref 0.7–4.0)
MCH: 29.4 pg (ref 26.0–34.0)
MCHC: 32 g/dL (ref 30.0–36.0)
MCV: 91.9 fL (ref 80.0–100.0)
Monocytes Absolute: 0.5 10*3/uL (ref 0.1–1.0)
Monocytes Relative: 6 %
Neutro Abs: 5.1 10*3/uL (ref 1.7–7.7)
Neutrophils Relative %: 63 %
Platelets: 192 10*3/uL (ref 150–400)
RBC: 5.2 MIL/uL — ABNORMAL HIGH (ref 3.87–5.11)
RDW: 13.4 % (ref 11.5–15.5)
WBC: 8.2 10*3/uL (ref 4.0–10.5)
nRBC: 0 % (ref 0.0–0.2)

## 2022-02-27 LAB — COMPREHENSIVE METABOLIC PANEL
ALT: 16 U/L (ref 0–44)
AST: 23 U/L (ref 15–41)
Albumin: 4.1 g/dL (ref 3.5–5.0)
Alkaline Phosphatase: 56 U/L (ref 38–126)
Anion gap: 7 (ref 5–15)
BUN: 11 mg/dL (ref 8–23)
CO2: 27 mmol/L (ref 22–32)
Calcium: 9.7 mg/dL (ref 8.9–10.3)
Chloride: 105 mmol/L (ref 98–111)
Creatinine, Ser: 0.82 mg/dL (ref 0.44–1.00)
GFR, Estimated: >60 mL/min (ref >60)
Glucose, Bld: 119 mg/dL — ABNORMAL HIGH (ref 70–99)
Potassium: 4 mmol/L (ref 3.5–5.1)
Sodium: 139 mmol/L (ref 135–145)
Total Bilirubin: 0.7 mg/dL (ref 0.3–1.2)
Total Protein: 7.6 g/dL (ref 6.5–8.1)

## 2022-02-27 LAB — T4, FREE: Free T4: 1.07 ng/dL (ref 0.61–1.12)

## 2022-02-27 LAB — LIPID PANEL
Cholesterol: 116 mg/dL (ref 0–200)
HDL: 44 mg/dL (ref >40)
LDL Cholesterol: 46 mg/dL (ref 0–99)
Total CHOL/HDL Ratio: 2.6 RATIO
Triglycerides: 130 mg/dL (ref <150)
VLDL: 26 mg/dL (ref 0–40)

## 2022-02-27 LAB — TSH: TSH: 2.498 u[IU]/mL (ref 0.350–4.500)

## 2022-02-27 LAB — VITAMIN D 25 HYDROXY (VIT D DEFICIENCY, FRACTURES): Vit D, 25-Hydroxy: 31.89 ng/mL (ref 30–100)

## 2022-02-27 LAB — FOLATE: Folate: 38 ng/mL (ref >5.9)

## 2022-02-27 LAB — VITAMIN B12: Vitamin B-12: 437 pg/mL (ref 180–914)

## 2022-03-03 ENCOUNTER — Ambulatory Visit (INDEPENDENT_AMBULATORY_CARE_PROVIDER_SITE_OTHER): Payer: Federal, State, Local not specified - PPO | Admitting: Physician Assistant

## 2022-03-03 ENCOUNTER — Encounter: Payer: Self-pay | Admitting: Physician Assistant

## 2022-03-03 DIAGNOSIS — E559 Vitamin D deficiency, unspecified: Secondary | ICD-10-CM | POA: Diagnosis not present

## 2022-03-03 DIAGNOSIS — R7303 Prediabetes: Secondary | ICD-10-CM | POA: Diagnosis not present

## 2022-03-03 DIAGNOSIS — I10 Essential (primary) hypertension: Secondary | ICD-10-CM

## 2022-03-03 DIAGNOSIS — E782 Mixed hyperlipidemia: Secondary | ICD-10-CM

## 2022-03-03 MED ORDER — SIMVASTATIN 20 MG PO TABS: ORAL_TABLET | ORAL | 1 refills | Status: DC

## 2022-03-03 MED ORDER — LOSARTAN POTASSIUM-HCTZ 100-25 MG PO TABS: 1.0000 | ORAL_TABLET | Freq: Every day | ORAL | 1 refills | Status: DC

## 2022-03-03 MED ORDER — EZETIMIBE 10 MG PO TABS: 10.0000 mg | ORAL_TABLET | Freq: Every day | ORAL | 3 refills | Status: DC

## 2022-03-03 MED ORDER — METOPROLOL SUCCINATE ER 100 MG PO TB24: 100.0000 mg | ORAL_TABLET | Freq: Every day | ORAL | 1 refills | Status: DC

## 2022-03-03 NOTE — Progress Notes (Signed)
[facility] ?[address] ?[place], [place] [address] ? ?Internal MEDICINE  ?Office Visit Note ? ?Patient Name: Breanna Flores ? [id number]  ?[id number] ? ?Date of Service: 03/03/2022 ? ?Chief Complaint  ?Patient presents with  ? Follow-up  ? Hyperlipidemia  ? Hypertension  ? ? ?HPI ?Pt is here for routine follow up ?-Pt has not been checking BP at home and has not been taking extra 1/2 tab of amlodipine.  BP remains borderline in office today and patient will start monitoring blood pressure closely and contact office if rising.  We will hold off on adjusting medications for now however if BP is elevated due to stress may take extra half tab of amlodipine if needed. ?-colonoscopy done, she states it went well and she was told she will need to repeat in 10years ?-sleeping with cpap nightly ?-labs show slightly elevated hemoglobin, borderline low vitamin D, and elevated glucose--known to be prediabetic ?-breathing stable ?-has stopped smoking completely at this time ? ?Current Medication: ?Outpatient Encounter Medications as of 03/03/2022  ?Medication Sig  ? amLODipine (NORVASC) 10 MG tablet TAKE 1 TABLET BY MOUTH ONCE DAILY  ? furosemide (LASIX) 20 MG tablet TAKE 1 TABLET BY MOUTH ONCE DAILY AS NEEDED  ? NON FORMULARY cpap device  ? [DISCONTINUED] ezetimibe (ZETIA) 10 MG tablet TAKE 1 TABLET BY MOUTH ONCE DAILY  ? [DISCONTINUED] losartan-hydrochlorothiazide (HYZAAR) 100-25 MG tablet TAKE 1 TABLET BY MOUTH ONCE DAILY  ? [DISCONTINUED] metoprolol succinate (TOPROL-XL) 100 MG 24 hr tablet TAKE 1 TABLET BY MOUTH ONCE DAILY  ? [DISCONTINUED] simvastatin (ZOCOR) 20 MG tablet TAKE 1 TABLET BY MOUTH ONCE EVERY EVENING  ? ezetimibe (ZETIA) 10 MG tablet Take 1 tablet (10 mg total) by mouth daily.  ? losartan-hydrochlorothiazide (HYZAAR) 100-25 MG tablet Take 1 tablet by mouth daily.  ? metoprolol succinate (TOPROL-XL) 100 MG 24 hr tablet Take 1 tablet (100 mg total) by mouth daily. Take with or immediately following a  meal.  ? simvastatin (ZOCOR) 20 MG tablet TAKE 1 TABLET BY MOUTH ONCE EVERY EVENING  ? [DISCONTINUED] polyethylene glycol-electrolytes (NULYTELY) 420 g solution 1st half of the bowel prep agent an 8 oz glass every 15-20 minutes until completed. Drink the 2nd half of the bowel prep agent, starting 5 hours before your procedure. Drink an 8 oz glass every 15-20 minutes until completed  ? ?No facility-administered encounter medications on file as of 03/03/2022.  ? ? ?Surgical History: ?Past Surgical History:  ?Procedure Laterality Date  ? COLONOSCOPY WITH PROPOFOL N/A 01/30/2022  ? Procedure: COLONOSCOPY WITH PROPOFOL;  Surgeon: Lucilla Lame, MD;  Location: [facility] ENDOSCOPY;  Service: Endoscopy;  Laterality: N/A;  ? [place]  ? ? ?Medical History: ?Past Medical History:  ?Diagnosis Date  ? Hyperlipidemia   ? Hypertension   ? Sleep apnea   ? ? ?Family History: ?Family History  ?Problem Relation Age of Onset  ? Dementia Mother   ? Heart murmur Mother   ? Hypertension Father   ? Gout Father   ? Hyperlipidemia Father   ? ? ?Social History  ? ?Socioeconomic History  ? Marital status: Divorced  ?  Spouse name: Not on file  ? Number of children: Not on file  ? Years of education: Not on file  ? Highest education level: Not on file  ?Occupational History  ? Not on file  ?Tobacco Use  ? Smoking status: Former  ?  Types: Cigarettes  ?  Quit date: 2016  ?  Years  since quitting: 7.3  ? Smokeless tobacco: Never  ?Substance and Sexual Activity  ? Alcohol use: Yes  ?  Comment: rarely  ? Drug use: Never  ? Sexual activity: Not on file  ?Other Topics Concern  ? Not on file  ?Social History Narrative  ? Not on file  ? ?Social Determinants of Health  ? ?Financial Resource Strain: Not on file  ?Food Insecurity: Not on file  ?Transportation Needs: Not on file  ?Physical Activity: Not on file  ?Stress: Not on file  ?Social Connections: Not on file  ?Intimate Partner Violence: Not on file  ? ? ? ? ?Review of Systems   ?Constitutional:  Negative for chills, fatigue and unexpected weight change.  ?HENT:  Negative for congestion, postnasal drip, rhinorrhea, sneezing and sore throat.   ?Eyes:  Negative for redness.  ?Respiratory:  Negative for cough, chest tightness and shortness of breath.   ?Cardiovascular:  Negative for chest pain and palpitations.  ?Gastrointestinal:  Negative for abdominal pain, constipation, diarrhea, nausea and vomiting.  ?Genitourinary:  Negative for dysuria and frequency.  ?Musculoskeletal:  Positive for arthralgias. Negative for back pain, joint swelling and neck pain.  ?Skin:  Negative for rash.  ?Neurological: Negative.  Negative for tremors and numbness.  ?Hematological:  Negative for adenopathy. Does not bruise/bleed easily.  ?Psychiatric/Behavioral:  Negative for behavioral problems (Depression), sleep disturbance and suicidal ideas.   ? ?Vital Signs: ?BP 140/82 Comment: 150/78  Pulse 60   Temp 98.3 ?F (36.8 ?C)   Resp 16   Ht '5\' 9"'$  (1.753 m)   Wt 218 lb 6.4 oz (99.1 kg)   SpO2 98%   BMI 32.25 kg/m?  ? ? ?Physical Exam ?Vitals and nursing note reviewed.  ?Constitutional:   ?   General: She is not in acute distress. ?   Appearance: She is well-developed. She is obese. She is not diaphoretic.  ?HENT:  ?   Head: Normocephalic and atraumatic.  ?   Mouth/Throat:  ?   Pharynx: No oropharyngeal exudate.  ?Eyes:  ?   Pupils: Pupils are equal, round, and reactive to light.  ?Neck:  ?   Thyroid: No thyromegaly.  ?   Vascular: No JVD.  ?   Trachea: No tracheal deviation.  ?Cardiovascular:  ?   Rate and Rhythm: Normal rate and regular rhythm.  ?   Heart sounds: Normal heart sounds. No murmur heard. ?  No friction rub. No gallop.  ?Pulmonary:  ?   Effort: Pulmonary effort is normal. No respiratory distress.  ?   Breath sounds: No wheezing or rales.  ?Chest:  ?   Chest wall: No tenderness.  ?Abdominal:  ?   General: Bowel sounds are normal.  ?   Palpations: Abdomen is soft.  ?Musculoskeletal:     ?    General: Normal range of motion.  ?   Cervical back: Normal range of motion and neck supple.  ?Lymphadenopathy:  ?   Cervical: No cervical adenopathy.  ?Skin: ?   General: Skin is warm and dry.  ?Neurological:  ?   Mental Status: She is alert and oriented to person, place, and time.  ?   Cranial Nerves: No cranial nerve deficit.  ?Psychiatric:     ?   Behavior: Behavior normal.     ?   Thought Content: Thought content normal.     ?   Judgment: Judgment normal.  ? ? ? ? ? ?Assessment/Plan: ?1. Essential hypertension ?Stable, continue current medications and begin monitoring  daily.  Patient will call if elevated readings prior to next visit and may take extra half tab of amlodipine if very elevated. ?- losartan-hydrochlorothiazide (HYZAAR) 100-25 MG tablet; Take 1 tablet by mouth daily.  Dispense: 90 tablet; Refill: 1 ?- metoprolol succinate (TOPROL-XL) 100 MG 24 hr tablet; Take 1 tablet (100 mg total) by mouth daily. Take with or immediately following a meal.  Dispense: 90 tablet; Refill: 1 ? ?2. Mixed hyperlipidemia ?Stable, continue current medications ?- ezetimibe (ZETIA) 10 MG tablet; Take 1 tablet (10 mg total) by mouth daily.  Dispense: 90 tablet; Refill: 3 ?- simvastatin (ZOCOR) 20 MG tablet; TAKE 1 TABLET BY MOUTH ONCE EVERY EVENING  Dispense: 90 tablet; Refill: 1 ? ?3. Vitamin D deficiency ?We will start on over-the-counter vitamin D supplement due to borderline reading ? ?4. Prediabetes ?We will continue to work on diet and exercise and plan for repeat A1c next visit for monitoring ? ? ?General Counseling: Zeena verbalizes understanding of the findings of todays visit and agrees with plan of treatment. I have discussed any further diagnostic evaluation that may be needed or ordered today. We also reviewed her medications today. she has been encouraged to call the office with any questions or concerns that should arise related to todays visit. ? ? ? ?No orders of the defined types were placed in this  encounter. ? ? ?Meds ordered this encounter  ?Medications  ? ezetimibe (ZETIA) 10 MG tablet  ?  Sig: Take 1 tablet (10 mg total) by mouth daily.  ?  Dispense:  90 tablet  ?  Refill:  3  ? losartan-hydrochlorothiazide (HYZAAR) 100-25 MG

## 2022-05-02 ENCOUNTER — Ambulatory Visit: Payer: Federal, State, Local not specified - PPO | Admitting: Physician Assistant

## 2022-06-06 ENCOUNTER — Ambulatory Visit: Payer: Federal, State, Local not specified - PPO | Admitting: Physician Assistant

## 2022-06-25 ENCOUNTER — Other Ambulatory Visit: Payer: Self-pay | Admitting: Physician Assistant

## 2022-06-25 DIAGNOSIS — E782 Mixed hyperlipidemia: Secondary | ICD-10-CM

## 2022-06-25 DIAGNOSIS — I10 Essential (primary) hypertension: Secondary | ICD-10-CM

## 2022-06-25 NOTE — Telephone Encounter (Signed)
Pt will get more refills when she comes in for appt on 07/04/22

## 2022-07-04 ENCOUNTER — Encounter: Payer: Self-pay | Admitting: Physician Assistant

## 2022-07-04 ENCOUNTER — Ambulatory Visit: Payer: Federal, State, Local not specified - PPO | Admitting: Physician Assistant

## 2022-07-04 VITALS — BP 151/70 | HR 59 | Temp 98.2°F | Resp 16 | Ht 69.0 in | Wt 216.2 lb

## 2022-07-04 DIAGNOSIS — I10 Essential (primary) hypertension: Secondary | ICD-10-CM | POA: Diagnosis not present

## 2022-07-04 DIAGNOSIS — R5383 Other fatigue: Secondary | ICD-10-CM

## 2022-07-04 DIAGNOSIS — R7303 Prediabetes: Secondary | ICD-10-CM

## 2022-07-04 LAB — POCT GLYCOSYLATED HEMOGLOBIN (HGB A1C): Hemoglobin A1C: 6 % — AB (ref 4.0–5.6)

## 2022-07-04 MED ORDER — FUROSEMIDE 20 MG PO TABS: 20.0000 mg | ORAL_TABLET | Freq: Every day | ORAL | 2 refills | Status: DC | PRN

## 2022-07-04 NOTE — Progress Notes (Signed)
[facility] [place],  [address]  Internal MEDICINE  Office Visit Note  Patient Name: Breanna Flores  [id number]  [id number]  Date of Service: 07/15/2022  Chief Complaint  Patient presents with   Follow-up   Hyperlipidemia   Hypertension    HPI Pt is here for routine follow up -Only had to take extra 1/2 tab of amlodipine one time -at home 140/80, will take extra 1/2 tab amlodipine daily ('15mg'$ ) -Dad is in town and BP normally up when he is here -Will check cmp for lasix use as she is using daily now -Will bring cuff next visit as it is usually more elevated in office and will compare to see if home readings accurate  Current Medication: Outpatient Encounter Medications as of 07/04/2022  Medication Sig   amLODipine (NORVASC) 10 MG tablet TAKE 1 TABLET BY MOUTH ONCE DAILY   ezetimibe (ZETIA) 10 MG tablet Take 1 tablet (10 mg total) by mouth daily.   losartan-hydrochlorothiazide (HYZAAR) 100-25 MG tablet Take 1 tablet by mouth daily.   metoprolol succinate (TOPROL-XL) 100 MG 24 hr tablet Take 1 tablet (100 mg total) by mouth daily. Take with or immediately following a meal.   NON FORMULARY cpap device   simvastatin (ZOCOR) 20 MG tablet TAKE 1 TABLET BY MOUTH ONCE EVERY EVENING   [DISCONTINUED] furosemide (LASIX) 20 MG tablet TAKE 1 TABLET BY MOUTH ONCE DAILY AS NEEDED   furosemide (LASIX) 20 MG tablet Take 1 tablet (20 mg total) by mouth daily as needed.   No facility-administered encounter medications on file as of 07/04/2022.    Surgical History: Past Surgical History:  Procedure Laterality Date   COLONOSCOPY WITH PROPOFOL N/A 01/30/2022   Procedure: COLONOSCOPY WITH PROPOFOL;  Surgeon: Lucilla Lame, MD;  Location: [facility] ENDOSCOPY;  Service: Endoscopy;  Laterality: N/A;   EYE SURGERY  1963, 1978    Medical History: Past Medical History:  Diagnosis Date   Hyperlipidemia    Hypertension    Sleep apnea     Family History: Family History   Problem Relation Age of Onset   Dementia Mother    Heart murmur Mother    Hypertension Father    Gout Father    Hyperlipidemia Father     Social History   Socioeconomic History   Marital status: Divorced    Spouse name: Not on file   Number of children: Not on file   Years of education: Not on file   Highest education level: Not on file  Occupational History   Not on file  Tobacco Use   Smoking status: Former    Types: Cigarettes    Quit date: 2016    Years since quitting: 7.6   Smokeless tobacco: Never  Substance and Sexual Activity   Alcohol use: Yes    Comment: rarely   Drug use: Never   Sexual activity: Not on file  Other Topics Concern   Not on file  Social History Narrative   Not on file   Social Determinants of Health   Financial Resource Strain: Not on file  Food Insecurity: Not on file  Transportation Needs: Not on file  Physical Activity: Not on file  Stress: Not on file  Social Connections: Not on file  Intimate Partner Violence: Not on file      Review of Systems  Constitutional:  Negative for chills, fatigue and unexpected weight change.  HENT:  Negative for congestion, postnasal drip, rhinorrhea, sneezing and sore throat.   Eyes:  Negative for redness.  Respiratory:  Negative for cough, chest tightness and shortness of breath.   Cardiovascular:  Negative for chest pain and palpitations.  Gastrointestinal:  Negative for abdominal pain, constipation, diarrhea, nausea and vomiting.  Genitourinary:  Negative for dysuria and frequency.  Musculoskeletal:  Positive for arthralgias. Negative for back pain, joint swelling and neck pain.  Skin:  Negative for rash.  Neurological: Negative.  Negative for tremors and numbness.  Hematological:  Negative for adenopathy. Does not bruise/bleed easily.  Psychiatric/Behavioral:  Negative for behavioral problems (Depression), sleep disturbance and suicidal ideas.     Vital Signs: BP (!) 151/70   Pulse (!)  59   Temp 98.2 F (36.8 C)   Resp 16   Ht '5\' 9"'$  (1.753 m)   Wt 216 lb 3.2 oz (98.1 kg)   SpO2 98%   BMI 31.93 kg/m    Physical Exam Vitals and nursing note reviewed.  Constitutional:      General: She is not in acute distress.    Appearance: She is well-developed. She is obese. She is not diaphoretic.  HENT:     Head: Normocephalic and atraumatic.     Mouth/Throat:     Pharynx: No oropharyngeal exudate.  Eyes:     Pupils: Pupils are equal, round, and reactive to light.  Neck:     Thyroid: No thyromegaly.     Vascular: No JVD.     Trachea: No tracheal deviation.  Cardiovascular:     Rate and Rhythm: Normal rate and regular rhythm.     Heart sounds: Normal heart sounds. No murmur heard.    No friction rub. No gallop.  Pulmonary:     Effort: Pulmonary effort is normal. No respiratory distress.     Breath sounds: No wheezing or rales.  Chest:     Chest wall: No tenderness.  Abdominal:     General: Bowel sounds are normal.     Palpations: Abdomen is soft.  Musculoskeletal:        General: Normal range of motion.     Cervical back: Normal range of motion and neck supple.  Lymphadenopathy:     Cervical: No cervical adenopathy.  Skin:    General: Skin is warm and dry.  Neurological:     Mental Status: She is alert and oriented to person, place, and time.     Cranial Nerves: No cranial nerve deficit.  Psychiatric:        Behavior: Behavior normal.        Thought Content: Thought content normal.        Judgment: Judgment normal.        Assessment/Plan: 1. Essential hypertension Elevated in office, will start taking extra 1/2 tab of amlodipine daily ('15mg'$  total) and will bring cuff next visit to compare to manual reading in office. Will check CMP as she is using lasix daily now. Pt will call if BP elevated at home. - furosemide (LASIX) 20 MG tablet; Take 1 tablet (20 mg total) by mouth daily as needed.  Dispense: 30 tablet; Refill: 2  2. Prediabetes - POCT HgB A1C  is 6.0 which is stable from previous, continue to work on diet ad exercise  3. Other fatigue - Comprehensive metabolic panel   General Counseling: Aiesha verbalizes understanding of the findings of todays visit and agrees with plan of treatment. I have discussed any further diagnostic evaluation that may be needed or ordered today. We also reviewed her medications today. she has been encouraged to call  the office with any questions or concerns that should arise related to todays visit.    Orders Placed This Encounter  Procedures   Comprehensive metabolic panel   POCT HgB A1C    Meds ordered this encounter  Medications   furosemide (LASIX) 20 MG tablet    Sig: Take 1 tablet (20 mg total) by mouth daily as needed.    Dispense:  30 tablet    Refill:  2    This patient was seen by Drema Dallas, PA-C in collaboration with Dr. Clayborn Bigness as a part of collaborative care agreement.   Total time spent:30 Minutes Time spent includes review of chart, medications, test results, and follow up plan with the patient.      Dr Lavera Guise Internal medicine

## 2022-08-02 ENCOUNTER — Other Ambulatory Visit: Payer: Self-pay | Admitting: Physician Assistant

## 2022-08-02 DIAGNOSIS — I10 Essential (primary) hypertension: Secondary | ICD-10-CM

## 2022-09-25 ENCOUNTER — Other Ambulatory Visit: Payer: Self-pay | Admitting: Physician Assistant

## 2022-09-25 DIAGNOSIS — I10 Essential (primary) hypertension: Secondary | ICD-10-CM

## 2022-09-25 DIAGNOSIS — E782 Mixed hyperlipidemia: Secondary | ICD-10-CM

## 2022-09-26 ENCOUNTER — Telehealth: Payer: Self-pay | Admitting: Internal Medicine

## 2022-09-26 NOTE — Telephone Encounter (Signed)
Lvm and sent mychart message notifying patient that 01/12/23 appointment has been moved to same day with AA-Toni

## 2022-10-01 ENCOUNTER — Other Ambulatory Visit: Payer: Self-pay | Admitting: Physician Assistant

## 2022-10-01 ENCOUNTER — Telehealth: Payer: Self-pay | Admitting: Physician Assistant

## 2022-10-01 DIAGNOSIS — I10 Essential (primary) hypertension: Secondary | ICD-10-CM

## 2022-10-01 MED ORDER — AMLODIPINE BESYLATE 10 MG PO TABS: ORAL_TABLET | ORAL | 1 refills | Status: DC

## 2022-10-06 NOTE — Telephone Encounter (Signed)
Error

## 2022-11-07 ENCOUNTER — Ambulatory Visit: Payer: Federal, State, Local not specified - PPO | Admitting: Physician Assistant

## 2022-12-01 ENCOUNTER — Encounter: Payer: Federal, State, Local not specified - PPO | Admitting: Physician Assistant

## 2022-12-01 ENCOUNTER — Other Ambulatory Visit: Payer: Self-pay | Admitting: Physician Assistant

## 2022-12-01 DIAGNOSIS — E782 Mixed hyperlipidemia: Secondary | ICD-10-CM

## 2022-12-15 ENCOUNTER — Ambulatory Visit
Admission: RE | Admit: 2022-12-15 | Discharge: 2022-12-15 | Disposition: A | Discharge status: Home / Self Care | Payer: Federal, State, Local not specified - PPO | Source: Ambulatory Visit | Attending: Physician Assistant | Admitting: Physician Assistant

## 2022-12-15 DIAGNOSIS — Z1231 Encounter for screening mammogram for malignant neoplasm of breast: Secondary | ICD-10-CM | POA: Diagnosis not present

## 2022-12-18 ENCOUNTER — Encounter: Payer: Federal, State, Local not specified - PPO | Admitting: Physician Assistant

## 2023-01-05 ENCOUNTER — Other Ambulatory Visit: Payer: Self-pay

## 2023-01-05 ENCOUNTER — Encounter: Payer: Federal, State, Local not specified - PPO | Admitting: Physician Assistant

## 2023-01-05 DIAGNOSIS — E782 Mixed hyperlipidemia: Secondary | ICD-10-CM

## 2023-01-05 MED ORDER — SIMVASTATIN 20 MG PO TABS: ORAL_TABLET | ORAL | 0 refills | Status: DC

## 2023-01-12 ENCOUNTER — Ambulatory Visit: Payer: Federal, State, Local not specified - PPO | Admitting: Nurse Practitioner

## 2023-01-27 ENCOUNTER — Ambulatory Visit: Payer: Federal, State, Local not specified - PPO | Admitting: Nurse Practitioner

## 2023-01-27 ENCOUNTER — Encounter: Payer: Self-pay | Admitting: Nurse Practitioner

## 2023-01-27 VITALS — BP 149/61 | HR 73 | Temp 97.3°F | Resp 16 | Ht 69.0 in | Wt 220.0 lb

## 2023-01-27 DIAGNOSIS — J4489 Other specified chronic obstructive pulmonary disease: Secondary | ICD-10-CM | POA: Diagnosis not present

## 2023-01-27 DIAGNOSIS — Z7189 Other specified counseling: Secondary | ICD-10-CM

## 2023-01-27 DIAGNOSIS — G4733 Obstructive sleep apnea (adult) (pediatric): Secondary | ICD-10-CM | POA: Diagnosis not present

## 2023-01-27 NOTE — Progress Notes (Signed)
[facility] [place], [place] [address]  Internal MEDICINE  Office Visit Note  Patient Name: Breanna Flores  [id number]  [id number]  Date of Service: 01/27/2023  Chief Complaint  Patient presents with   Hyperlipidemia   Hypertension   Follow-up    HPI Breanna Flores presents for a follow-up visit for OSA and COPD.  OSA -- doing well per patient, got CPAP online so not using a DME company.  COPD -- feels like her breathing is fine but allergies have been bothering her lately.  Has not lost anymore weight Still has abstained from smoking since last visit with Dr. Devona Konig in march last year.     Current Medication: Outpatient Encounter Medications as of 01/27/2023  Medication Sig   amLODipine (NORVASC) 10 MG tablet Take 1.5 tabs (15mg ) by mouth daily.   ezetimibe (ZETIA) 10 MG tablet Take 1 tablet (10 mg total) by mouth daily.   furosemide (LASIX) 20 MG tablet Take 1 tablet (20 mg total) by mouth daily as needed.   losartan-hydrochlorothiazide (HYZAAR) 100-25 MG tablet TAKE 1 TABLET BY MOUTH ONCE DAILY   metoprolol succinate (TOPROL-XL) 100 MG 24 hr tablet Take 1 tablet (100 mg total) by mouth daily. Take with or immediately following a meal.   NON FORMULARY cpap device   simvastatin (ZOCOR) 20 MG tablet Take 1 tab po daily   No facility-administered encounter medications on file as of 01/27/2023.    Surgical History: Past Surgical History:  Procedure Laterality Date   COLONOSCOPY WITH PROPOFOL N/A 01/30/2022   Procedure: COLONOSCOPY WITH PROPOFOL;  Surgeon: Lucilla Lame, MD;  Location: [facility] ENDOSCOPY;  Service: Endoscopy;  Laterality: N/A;   EYE SURGERY  1963, 1978    Medical History: Past Medical History:  Diagnosis Date   Hyperlipidemia    Hypertension    Sleep apnea     Family History: Family History  Problem Relation Age of Onset   Dementia Mother    Heart murmur Mother    Hypertension Father    Gout Father    Hyperlipidemia Father      Social History   Socioeconomic History   Marital status: Divorced    Spouse name: Not on file   Number of children: Not on file   Years of education: Not on file   Highest education level: Not on file  Occupational History   Not on file  Tobacco Use   Smoking status: Former    Types: Cigarettes    Quit date: 2016    Years since quitting: 8.2   Smokeless tobacco: Never  Substance and Sexual Activity   Alcohol use: Yes    Comment: rarely   Drug use: Never   Sexual activity: Not on file  Other Topics Concern   Not on file  Social History Narrative   Not on file   Social Determinants of Health   Financial Resource Strain: Not on file  Food Insecurity: Not on file  Transportation Needs: Not on file  Physical Activity: Not on file  Stress: Not on file  Social Connections: Not on file  Intimate Partner Violence: Not on file      Review of Systems  Constitutional: Negative.  Negative for chills, fatigue and unexpected weight change.  HENT: Negative.  Negative for congestion, rhinorrhea, sneezing and sore throat.   Respiratory: Negative.  Negative for cough, chest tightness, shortness of breath and wheezing.   Cardiovascular: Negative.  Negative for chest pain and palpitations.  Gastrointestinal:  Negative  for abdominal pain, constipation, diarrhea, nausea and vomiting.  Musculoskeletal: Negative.  Negative for arthralgias, back pain, joint swelling and neck pain.  Skin:  Negative for rash.  Neurological: Negative.  Negative for tremors and numbness.  Psychiatric/Behavioral:  Negative for behavioral problems (Depression), sleep disturbance and suicidal ideas. The patient is not nervous/anxious.     Vital Signs: BP (!) 149/61   Pulse 73   Temp (!) 97.3 F (36.3 C)   Resp 16   Ht 5\' 9"  (1.753 m)   Wt 220 lb (99.8 kg)   SpO2 98%   BMI 32.49 kg/m    Physical Exam Vitals reviewed.  Constitutional:      General: She is not in acute distress.    Appearance:  Normal appearance. She is obese. She is not ill-appearing.  HENT:     Head: Normocephalic and atraumatic.  Eyes:     Pupils: Pupils are equal, round, and reactive to light.  Cardiovascular:     Rate and Rhythm: Normal rate and regular rhythm.     Heart sounds: No murmur heard.    No friction rub. No gallop.  Pulmonary:     Effort: Pulmonary effort is normal. No respiratory distress.     Breath sounds: Normal breath sounds. No wheezing.  Neurological:     Mental Status: She is alert and oriented to person, place, and time.  Psychiatric:        Mood and Affect: Mood normal.        Behavior: Behavior normal.        Assessment/Plan: 1. Obstructive chronic bronchitis without exacerbation Stable, not on any maintenance inhalers. No issues.   2. OSA (obstructive sleep apnea) Continue using CPAP machine as instructed  3. CPAP use counseling No questions or concerns related to the use or maintenance of her CPAP machine  4. Obesity, morbid ([place]) No significant weight loss   General Counseling: Breanna Flores verbalizes understanding of the findings of todays visit and agrees with plan of treatment. I have discussed any further diagnostic evaluation that may be needed or ordered today. We also reviewed her medications today. she has been encouraged to call the office with any questions or concerns that should arise related to todays visit.    No orders of the defined types were placed in this encounter.   No orders of the defined types were placed in this encounter.   Return in about 1 year (around 01/27/2024) for F/U, pulmonary/sleep with DSK or Breanna Flores .   Total time spent:30 Minutes Time spent includes review of chart, medications, test results, and follow up plan with the patient.   [place] Controlled Substance Database was reviewed by me.  This patient was seen by Jonetta Osgood, FNP-C in collaboration with Dr. Clayborn Bigness as a part of collaborative care agreement.   Breanna Flores R.  Valetta Fuller, MSN, FNP-C Internal medicine

## 2023-01-30 ENCOUNTER — Ambulatory Visit (INDEPENDENT_AMBULATORY_CARE_PROVIDER_SITE_OTHER): Payer: Federal, State, Local not specified - PPO | Admitting: Physician Assistant

## 2023-01-30 ENCOUNTER — Encounter: Payer: Self-pay | Admitting: Physician Assistant

## 2023-01-30 ENCOUNTER — Other Ambulatory Visit: Payer: Self-pay | Admitting: Physician Assistant

## 2023-01-30 ENCOUNTER — Other Ambulatory Visit
Admission: RE | Admit: 2023-01-30 | Discharge: 2023-01-30 | Disposition: A | Discharge status: Home / Self Care | Payer: Federal, State, Local not specified - PPO | Attending: Physician Assistant | Admitting: Physician Assistant

## 2023-01-30 VITALS — BP 158/70 | HR 70 | Temp 97.8°F | Resp 16 | Ht 69.0 in | Wt 216.0 lb

## 2023-01-30 DIAGNOSIS — Z0001 Encounter for general adult medical examination with abnormal findings: Secondary | ICD-10-CM | POA: Diagnosis not present

## 2023-01-30 DIAGNOSIS — R300 Dysuria: Secondary | ICD-10-CM

## 2023-01-30 DIAGNOSIS — M79604 Pain in right leg: Secondary | ICD-10-CM | POA: Diagnosis not present

## 2023-01-30 DIAGNOSIS — E538 Deficiency of other specified B group vitamins: Secondary | ICD-10-CM

## 2023-01-30 DIAGNOSIS — I10 Essential (primary) hypertension: Secondary | ICD-10-CM

## 2023-01-30 DIAGNOSIS — I739 Peripheral vascular disease, unspecified: Secondary | ICD-10-CM | POA: Diagnosis not present

## 2023-01-30 DIAGNOSIS — R7303 Prediabetes: Secondary | ICD-10-CM

## 2023-01-30 DIAGNOSIS — R5383 Other fatigue: Secondary | ICD-10-CM | POA: Insufficient documentation

## 2023-01-30 DIAGNOSIS — E559 Vitamin D deficiency, unspecified: Secondary | ICD-10-CM

## 2023-01-30 DIAGNOSIS — M7989 Other specified soft tissue disorders: Secondary | ICD-10-CM | POA: Diagnosis not present

## 2023-01-30 DIAGNOSIS — E782 Mixed hyperlipidemia: Secondary | ICD-10-CM

## 2023-01-30 DIAGNOSIS — R946 Abnormal results of thyroid function studies: Secondary | ICD-10-CM

## 2023-01-30 LAB — COMPREHENSIVE METABOLIC PANEL
ALT: 18 U/L (ref 0–44)
AST: 28 U/L (ref 15–41)
Albumin: 4.5 g/dL (ref 3.5–5.0)
Alkaline Phosphatase: 63 U/L (ref 38–126)
Anion gap: 15 (ref 5–15)
BUN: 14 mg/dL (ref 8–23)
CO2: 26 mmol/L (ref 22–32)
Calcium: 10.3 mg/dL (ref 8.9–10.3)
Chloride: 101 mmol/L (ref 98–111)
Creatinine, Ser: 0.77 mg/dL (ref 0.44–1.00)
GFR, Estimated: >60 mL/min (ref >60)
Glucose, Bld: 131 mg/dL — ABNORMAL HIGH (ref 70–99)
Potassium: 3.6 mmol/L (ref 3.5–5.1)
Sodium: 142 mmol/L (ref 135–145)
Total Bilirubin: 1 mg/dL (ref 0.3–1.2)
Total Protein: 8 g/dL (ref 6.5–8.1)

## 2023-01-30 MED ORDER — AMLODIPINE BESYLATE 5 MG PO TABS: 5.0000 mg | ORAL_TABLET | Freq: Every day | ORAL | 1 refills | Status: DC

## 2023-01-30 MED ORDER — HYDRALAZINE HCL 10 MG PO TABS: 10.0000 mg | ORAL_TABLET | Freq: Every day | ORAL | 2 refills | Status: DC

## 2023-01-30 MED ORDER — POTASSIUM CHLORIDE ER 8 MEQ PO TBCR: EXTENDED_RELEASE_TABLET | ORAL | 3 refills | Status: DC

## 2023-01-30 NOTE — Progress Notes (Signed)
[facility] [place], [place] [address]  Internal MEDICINE  Office Visit Note  Patient Name: Breanna Flores  [id number]  [id number]  Date of Service: 02/04/2023  Chief Complaint  Patient presents with   Annual Exam   Hyperlipidemia   Hypertension    HPI Pt is here for routine follow up -Right upper leg pain for the last two weeks, no known injury. Constant pain, but worse with walking. Possible muscle/hip flexor strain. Not impacting sleep. No swelling, sensation changes, or color changes.  -Has not taken anything for it and will try aleve/ibuprofen along with gentle stretching. Does not travel beyond upper leg/groin.  -She does report being told she had narrowing of the arteries in her leg previously and would like to follow up on this with vascular -Bp at home 140-150, states it is difficult to split amlodipine in 1/2 evenly to take 1.5 tabs and will send separate 5mg  tab to take with the 10mg . Will also add hydralazine once per day with additional dose to be taken if BP remains high. -Lasix daily and cmp was updated to check potassium and kidney function which were normal, though potassium is borderline low and she does have occasional cramping. Will add low dose potassium supplement a few days per week and recheck. Her sugar was elevated and will add A1c to labs for monitoring as she is prediabetic  Current Medication: Outpatient Encounter Medications as of 01/30/2023  Medication Sig   amLODipine (NORVASC) 10 MG tablet Take 1.5 tabs (15mg ) by mouth daily.   amLODipine (NORVASC) 5 MG tablet Take 1 tablet (5 mg total) by mouth daily.   ezetimibe (ZETIA) 10 MG tablet Take 1 tablet (10 mg total) by mouth daily.   hydrALAZINE (APRESOLINE) 10 MG tablet Take 1 tablet (10 mg total) by mouth daily.   losartan-hydrochlorothiazide (HYZAAR) 100-25 MG tablet TAKE 1 TABLET BY MOUTH ONCE DAILY   NON FORMULARY cpap device   potassium chloride (KLOR-CON) 8 MEQ tablet Take  1 tablet 2 days per week   simvastatin (ZOCOR) 20 MG tablet Take 1 tab po daily   [DISCONTINUED] furosemide (LASIX) 20 MG tablet Take 1 tablet (20 mg total) by mouth daily as needed.   [DISCONTINUED] metoprolol succinate (TOPROL-XL) 100 MG 24 hr tablet Take 1 tablet (100 mg total) by mouth daily. Take with or immediately following a meal.   No facility-administered encounter medications on file as of 01/30/2023.    Surgical History: Past Surgical History:  Procedure Laterality Date   COLONOSCOPY WITH PROPOFOL N/A 01/30/2022   Procedure: COLONOSCOPY WITH PROPOFOL;  Surgeon: Lucilla Lame, MD;  Location: [facility] ENDOSCOPY;  Service: Endoscopy;  Laterality: N/A;   EYE SURGERY  1963, 1978    Medical History: Past Medical History:  Diagnosis Date   Hyperlipidemia    Hypertension    Sleep apnea     Family History: Family History  Problem Relation Age of Onset   Dementia Mother    Heart murmur Mother    Hypertension Father    Gout Father    Hyperlipidemia Father     Social History   Socioeconomic History   Marital status: Divorced    Spouse name: Not on file   Number of children: Not on file   Years of education: Not on file   Highest education level: Not on file  Occupational History   Not on file  Tobacco Use   Smoking status: Former    Types: Cigarettes    Quit date:  2016    Years since quitting: 8.2   Smokeless tobacco: Never  Substance and Sexual Activity   Alcohol use: Yes    Comment: rarely   Drug use: Never   Sexual activity: Not on file  Other Topics Concern   Not on file  Social History Narrative   Not on file   Social Determinants of Health   Financial Resource Strain: Not on file  Food Insecurity: Not on file  Transportation Needs: Not on file  Physical Activity: Not on file  Stress: Not on file  Social Connections: Not on file  Intimate Partner Violence: Not on file      Review of Systems  Constitutional: Negative.  Negative for chills,  fatigue and unexpected weight change.  HENT: Negative.  Negative for congestion, rhinorrhea, sneezing and sore throat.   Respiratory: Negative.  Negative for cough, chest tightness, shortness of breath and wheezing.   Cardiovascular: Negative.  Negative for chest pain and palpitations.  Gastrointestinal:  Negative for abdominal pain, constipation, diarrhea, nausea and vomiting.  Musculoskeletal:  Positive for arthralgias and myalgias. Negative for back pain, joint swelling and neck pain.  Skin:  Negative for rash.  Neurological: Negative.  Negative for tremors and numbness.  Psychiatric/Behavioral:  Negative for behavioral problems (Depression), sleep disturbance and suicidal ideas. The patient is not nervous/anxious.     Vital Signs: BP (!) 158/70 Comment: 177/72  Pulse 70   Temp 97.8 F (36.6 C)   Resp 16   Ht 5\' 9"  (1.753 m)   Wt 216 lb (98 kg)   SpO2 98%   BMI 31.90 kg/m    Physical Exam Vitals and nursing note reviewed.  Constitutional:      General: She is not in acute distress.    Appearance: She is well-developed. She is obese. She is not diaphoretic.  HENT:     Head: Normocephalic and atraumatic.     Mouth/Throat:     Pharynx: No oropharyngeal exudate.  Eyes:     Pupils: Pupils are equal, round, and reactive to light.  Neck:     Thyroid: No thyromegaly.     Vascular: No JVD.     Trachea: No tracheal deviation.  Cardiovascular:     Rate and Rhythm: Normal rate and regular rhythm.     Heart sounds: Normal heart sounds. No murmur heard.    No friction rub. No gallop.  Pulmonary:     Effort: Pulmonary effort is normal. No respiratory distress.     Breath sounds: No wheezing or rales.  Chest:     Chest wall: No tenderness.  Abdominal:     General: Bowel sounds are normal.     Palpations: Abdomen is soft.  Musculoskeletal:        General: Tenderness present. Normal range of motion.     Cervical back: Normal range of motion and neck supple.     Comments:  Tightness and discomfort along right upper leg/groin with active hip flexion, no pain with passive ROM, no swelling appreciated  Lymphadenopathy:     Cervical: No cervical adenopathy.  Skin:    General: Skin is warm and dry.  Neurological:     Mental Status: She is alert and oriented to person, place, and time.     Cranial Nerves: No cranial nerve deficit.  Psychiatric:        Behavior: Behavior normal.        Thought Content: Thought content normal.        Judgment: Judgment  normal.        Assessment/Plan: 1. Encounter for general adult medical examination with abnormal findings CPE performed, UTD on PHM, will have labs done  2. Essential hypertension Will add hydralazine once daily and may take additional dose if BP remains elevated - amLODipine (NORVASC) 5 MG tablet; Take 1 tablet (5 mg total) by mouth daily.  Dispense: 90 tablet; Refill: 1 - hydrALAZINE (APRESOLINE) 10 MG tablet; Take 1 tablet (10 mg total) by mouth daily.  Dispense: 30 tablet; Refill: 2  3. PAD (peripheral artery disease) ([place]) Will refer to vascular to updated testing and management - Ambulatory referral to Vascular Surgery  4. Leg swelling May continue lasix and try compression stockings. Will add low dose potassium supplement and recheck labs. Will also refer to vascular for further evaluation. - Ambulatory referral to Vascular Surgery  5. Anterior leg pain, right Likely due to muscle/hip flexor strain and will try NSAIDs and gentle stretching - Ambulatory referral to Vascular Surgery  6. Mixed hyperlipidemia - Lipid Panel With LDL/HDL Ratio  7. Vitamin D deficiency - VITAMIN D 25 Hydroxy (Vit-D Deficiency, Fractures)  8. Prediabetes - Hgb A1C w/o eAG  9. Other fatigue - TSH + free T4 - Hgb A1C w/o eAG - CBC w/Diff/Platelet - Lipid Panel With LDL/HDL Ratio - Comprehensive metabolic panel  10. B12 deficiency - B12 and Folate Panel  11. Abnormal thyroid exam - TSH + free T4  12.  Dysuria - UA/M w/rflx Culture, Routine   General Counseling: Wenona verbalizes understanding of the findings of todays visit and agrees with plan of treatment. I have discussed any further diagnostic evaluation that may be needed or ordered today. We also reviewed her medications today. she has been encouraged to call the office with any questions or concerns that should arise related to todays visit.    Orders Placed This Encounter  Procedures   Microscopic Examination   UA/M w/rflx Culture, Routine   TSH + free T4   Hgb A1C w/o eAG   CBC w/Diff/Platelet   Lipid Panel With LDL/HDL Ratio   Comprehensive metabolic panel   VITAMIN D 25 Hydroxy (Vit-D Deficiency, Fractures)   B12 and Folate Panel   Ambulatory referral to Vascular Surgery    Meds ordered this encounter  Medications   potassium chloride (KLOR-CON) 8 MEQ tablet    Sig: Take 1 tablet 2 days per week    Dispense:  30 tablet    Refill:  3   amLODipine (NORVASC) 5 MG tablet    Sig: Take 1 tablet (5 mg total) by mouth daily.    Dispense:  90 tablet    Refill:  1    Take with 10mg  pill for total of 15mg  daily   hydrALAZINE (APRESOLINE) 10 MG tablet    Sig: Take 1 tablet (10 mg total) by mouth daily.    Dispense:  30 tablet    Refill:  2    This patient was seen by Drema Dallas, PA-C in collaboration with Dr. Clayborn Bigness as a part of collaborative care agreement.   Total time spent:40 Minutes Time spent includes review of chart, medications, test results, and follow up plan with the patient.      Dr Lavera Guise Internal medicine

## 2023-01-31 LAB — UA/M W/RFLX CULTURE, ROUTINE
Bilirubin, UA: NEGATIVE
Glucose, UA: NEGATIVE
Ketones, UA: NEGATIVE
Leukocytes,UA: NEGATIVE
Nitrite, UA: NEGATIVE
Protein,UA: NEGATIVE
RBC, UA: NEGATIVE
Specific Gravity, UA: 1.014 (ref 1.005–1.030)
Urobilinogen, Ur: 0.2 mg/dL (ref 0.2–1.0)
pH, UA: 7.5 (ref 5.0–7.5)

## 2023-01-31 LAB — MICROSCOPIC EXAMINATION
Bacteria, UA: NONE SEEN
Casts: NONE SEEN /lpf

## 2023-03-09 ENCOUNTER — Other Ambulatory Visit (INDEPENDENT_AMBULATORY_CARE_PROVIDER_SITE_OTHER): Payer: Self-pay | Admitting: Nurse Practitioner

## 2023-03-09 DIAGNOSIS — M79606 Pain in leg, unspecified: Secondary | ICD-10-CM

## 2023-03-09 DIAGNOSIS — I739 Peripheral vascular disease, unspecified: Secondary | ICD-10-CM

## 2023-03-12 ENCOUNTER — Ambulatory Visit (INDEPENDENT_AMBULATORY_CARE_PROVIDER_SITE_OTHER): Payer: Federal, State, Local not specified - PPO | Admitting: Vascular Surgery

## 2023-03-12 ENCOUNTER — Ambulatory Visit (INDEPENDENT_AMBULATORY_CARE_PROVIDER_SITE_OTHER): Payer: Federal, State, Local not specified - PPO

## 2023-03-12 VITALS — BP 146/74 | HR 63 | Resp 16 | Ht 69.0 in | Wt 211.4 lb

## 2023-03-12 DIAGNOSIS — I739 Peripheral vascular disease, unspecified: Secondary | ICD-10-CM | POA: Diagnosis not present

## 2023-03-12 DIAGNOSIS — I70219 Atherosclerosis of native arteries of extremities with intermittent claudication, unspecified extremity: Secondary | ICD-10-CM | POA: Insufficient documentation

## 2023-03-12 DIAGNOSIS — E782 Mixed hyperlipidemia: Secondary | ICD-10-CM | POA: Diagnosis not present

## 2023-03-12 DIAGNOSIS — M79606 Pain in leg, unspecified: Secondary | ICD-10-CM

## 2023-03-12 DIAGNOSIS — I70213 Atherosclerosis of native arteries of extremities with intermittent claudication, bilateral legs: Secondary | ICD-10-CM | POA: Diagnosis not present

## 2023-03-12 DIAGNOSIS — J4489 Other specified chronic obstructive pulmonary disease: Secondary | ICD-10-CM

## 2023-03-12 DIAGNOSIS — I10 Essential (primary) hypertension: Secondary | ICD-10-CM | POA: Diagnosis not present

## 2023-03-12 LAB — VAS US ABI WITH/WO TBI
Left ABI: 0.79
Right ABI: 0.74

## 2023-03-14 ENCOUNTER — Encounter (INDEPENDENT_AMBULATORY_CARE_PROVIDER_SITE_OTHER): Payer: Self-pay | Admitting: Vascular Surgery

## 2023-03-14 NOTE — Progress Notes (Signed)
MRN : [id number]  Breanna Flores is a 62 y.o. (04-Apr-1961) female who presents with chief complaint of check circulation.  History of Present Illness:   The patient returns to the office for followup and review of the noninvasive studies.   There have been no interval changes in lower extremity symptoms. No interval shortening of the patient's claudication distance or development of rest pain symptoms. No new ulcers or wounds have occurred since the last visit.  There have been no significant changes to the patient's overall health care.  The patient denies amaurosis fugax or recent TIA symptoms. There are no documented recent neurological changes noted. There is no history of DVT, PE or superficial thrombophlebitis. The patient denies recent episodes of angina or shortness of breath.   ABI Rt=0.74 and Lt=0.79     Current Meds  Medication Sig   amLODipine (NORVASC) 10 MG tablet Take 1.5 tabs (15mg ) by mouth daily.   amLODipine (NORVASC) 5 MG tablet Take 1 tablet (5 mg total) by mouth daily.   aspirin EC 81 MG tablet Take 81 mg by mouth daily. Swallow whole.   ezetimibe (ZETIA) 10 MG tablet Take 1 tablet (10 mg total) by mouth daily.   furosemide (LASIX) 20 MG tablet TAKE 1 TABLET BY MOUTH ONCE DAILY AS NEEDED   hydrALAZINE (APRESOLINE) 10 MG tablet Take 1 tablet (10 mg total) by mouth daily.   losartan-hydrochlorothiazide (HYZAAR) 100-25 MG tablet TAKE 1 TABLET BY MOUTH ONCE DAILY   metoprolol succinate (TOPROL-XL) 100 MG 24 hr tablet TAKE 1 TABLET BY MOUTH ONCE DAILY   Multiple Vitamin (MULTIVITAMIN) tablet Take 1 tablet by mouth daily.   NON FORMULARY cpap device   potassium chloride (KLOR-CON) 8 MEQ tablet Take 1 tablet 2 days per week   simvastatin (ZOCOR) 20 MG tablet Take 1 tab po daily    Past Medical History:  Diagnosis Date   Hyperlipidemia    Hypertension    Sleep apnea     Past Surgical History:  Procedure Laterality Date   COLONOSCOPY WITH  PROPOFOL N/A 01/30/2022   Procedure: COLONOSCOPY WITH PROPOFOL;  Surgeon: Midge Minium, MD;  Location: [facility] ENDOSCOPY;  Service: Endoscopy;  Laterality: N/A;   EYE SURGERY  1963, 1978    Social History Social History   Tobacco Use   Smoking status: Former    Types: Cigarettes    Quit date: 2016    Years since quitting: 8.3   Smokeless tobacco: Never  Substance Use Topics   Alcohol use: Yes    Comment: rarely   Drug use: Never    Family History Family History  Problem Relation Age of Onset   Dementia Mother    Heart murmur Mother    Hypertension Father    Gout Father    Hyperlipidemia Father     Allergies  Allergen Reactions   Erythromycin Rash   Penicillins Rash   Tape Rash    Specifically ACE wrap      REVIEW OF SYSTEMS (Negative unless checked)  Constitutional: [] Weight loss  [] Fever  [] Chills Cardiac: [] Chest pain   [] Chest pressure   [] Palpitations   [] Shortness of breath when laying flat   [] Shortness of breath with exertion. Vascular:  [x] Pain in legs with walking   [] Pain in legs at rest  [] History of DVT   [] Phlebitis   [] Swelling in legs   [] Varicose veins   [] Non-healing ulcers Pulmonary:   []   Uses home oxygen   [] Productive cough   [] Hemoptysis   [] Wheeze  [] COPD   [] Asthma Neurologic:  [] Dizziness   [] Seizures   [] History of stroke   [] History of TIA  [] Aphasia   [] Vissual changes   [] Weakness or numbness in arm   [] Weakness or numbness in leg Musculoskeletal:   [] Joint swelling   [] Joint pain   [] Low back pain Hematologic:  [] Easy bruising  [] Easy bleeding   [] Hypercoagulable state   [] Anemic Gastrointestinal:  [] Diarrhea   [] Vomiting  [] Gastroesophageal reflux/heartburn   [] Difficulty swallowing. Genitourinary:  [] Chronic kidney disease   [] Difficult urination  [] Frequent urination   [] Blood in urine Skin:  [] Rashes   [] Ulcers  Psychological:  [] History of anxiety   []  History of major depression.  Physical Examination  Vitals:   03/12/23 1437  BP:  (!) 146/74  Pulse: 63  Resp: 16  Weight: 211 lb 6.4 oz (95.9 kg)  Height: 5\' 9"  (1.753 m)   Body mass index is 31.22 kg/m. Gen: WD/WN, NAD Head: [place]/AT, No temporalis wasting.  Ear/Nose/Throat: Hearing grossly intact, nares w/o erythema or drainage Eyes: PER, EOMI, sclera nonicteric.  Neck: Supple, no masses.  No bruit or JVD.  Pulmonary:  Good air movement, no audible wheezing, no use of accessory muscles.  Cardiac: RRR, normal S1, S2, no Murmurs. Vascular:  mild trophic changes, no open wounds Vessel Right Left  Radial Palpable Palpable  PT Not Palpable Not Palpable  DP Not Palpable Not Palpable  Gastrointestinal: soft, non-distended. No guarding/no peritoneal signs.  Musculoskeletal: M/S 5/5 throughout.  No visible deformity.  Neurologic: CN 2-12 intact. Pain and light touch intact in extremities.  Symmetrical.  Speech is fluent. Motor exam as listed above. Psychiatric: Judgment intact, Mood & affect appropriate for pt's clinical situation. Dermatologic: No rashes or ulcers noted.  No changes consistent with cellulitis.   CBC Lab Results  Component Value Date   WBC 8.2 02/27/2022   HGB 15.3 (H) 02/27/2022   HCT 47.8 (H) 02/27/2022   MCV 91.9 02/27/2022   PLT 192 02/27/2022    BMET    Component Value Date/Time   NA 142 01/30/2023 0720   K 3.6 01/30/2023 0720   CL 101 01/30/2023 0720   CO2 26 01/30/2023 0720   GLUCOSE 131 (H) 01/30/2023 0720   BUN 14 01/30/2023 0720   CREATININE 0.77 01/30/2023 0720   CALCIUM 10.3 01/30/2023 0720   GFRNONAA >60 01/30/2023 0720   GFRAA >60 04/19/2020 1018   CrCl cannot be calculated (Patient's most recent lab result is older than the maximum 21 days allowed.).  COAG No results found for: "INR", "PROTIME"  Radiology VAS [place] ABI WITH/WO TBI  Result Date: 03/12/2023  LOWER EXTREMITY DOPPLER STUDY Patient Name:  Breanna Flores  Date of Exam:   03/12/2023 Medical Rec #: [id number]       Accession #:    [id number] Date of Birth:  03-28-1961        Patient Gender: F Patient Age:   55 years Exam Location:  [facility] Procedure:      VAS [place] ABI WITH/WO TBI Referring Phys: Levora Dredge --------------------------------------------------------------------------------  Indications: Peripheral artery disease.  Performing Technologist: Debbe Bales RVS  Examination Guidelines: A complete evaluation includes at minimum, Doppler waveform signals and systolic blood pressure reading at the level of bilateral brachial, anterior tibial, and posterior tibial arteries, when vessel segments are accessible. Bilateral testing is considered an integral part of a complete examination. Photoelectric Plethysmograph (PPG) waveforms  and toe systolic pressure readings are included as required and additional duplex testing as needed. Limited examinations for reoccurring indications may be performed as noted.  ABI Findings: +---------+------------------+-----+--------+--------+ Right    Rt Pressure (mmHg)IndexWaveformComment  +---------+------------------+-----+--------+--------+ Brachial 179                                     +---------+------------------+-----+--------+--------+ ATA      122               0.68 biphasic         +---------+------------------+-----+--------+--------+ PTA      132               0.74 biphasic         +---------+------------------+-----+--------+--------+ Great Toe151               0.84 Normal           +---------+------------------+-----+--------+--------+ +---------+------------------+-----+--------+-------+ Left     Lt Pressure (mmHg)IndexWaveformComment +---------+------------------+-----+--------+-------+ Brachial 172                                    +---------+------------------+-----+--------+-------+ ATA      130               0.73 biphasic        +---------+------------------+-----+--------+-------+ PTA      141               0.79 biphasic         +---------+------------------+-----+--------+-------+ Great Toe139               0.78 Normal          +---------+------------------+-----+--------+-------+ +-------+-----------+-----------+------------+------------+ ABI/TBIToday's ABIToday's TBIPrevious ABIPrevious TBI +-------+-----------+-----------+------------+------------+ Right  .74        .84                                 +-------+-----------+-----------+------------+------------+ Left   .79        .78                                 +-------+-----------+-----------+------------+------------+  Summary: Right: Resting right ankle-brachial index indicates moderate right lower extremity arterial disease. The right toe-brachial index is normal. Left: Resting left ankle-brachial index indicates moderate left lower extremity arterial disease. The left toe-brachial index is normal. *See table(s) above for measurements and observations.  Electronically signed by Levora Dredge MD on 03/12/2023 at 3:23:51 PM.    Final      Assessment/Plan 1. Atherosclerosis of native artery of both lower extremities with intermittent claudication (HCC)  Recommend:  The patient has evidence of atherosclerosis of the lower extremities with claudication.  The patient does not voice lifestyle limiting changes at this point in time.  Noninvasive studies do not suggest clinically significant change.  No invasive studies, angiography or surgery at this time The patient should continue walking and begin a more formal exercise program.  The patient should continue antiplatelet therapy and aggressive treatment of the lipid abnormalities  No changes in the patient's medications at this time  Continued surveillance is indicated as atherosclerosis is likely to progress with time.    The patient will continue follow up with noninvasive studies as ordered.  - VAS [place]  ABI WITH/WO TBI; Future  2. Essential hypertension Continue antihypertensive  medications as already ordered, these medications have been reviewed and there are no changes at this time.  3. Obstructive chronic bronchitis without exacerbation Continue pulmonary medications and aerosols as already ordered, these medications have been reviewed and there are no changes at this time.   4. Mixed hyperlipidemia Continue statin as ordered and reviewed, no changes at this time    Levora Dredge, MD  03/14/2023 3:00 PM

## 2023-03-16 ENCOUNTER — Encounter: Payer: Self-pay | Admitting: Physician Assistant

## 2023-03-16 ENCOUNTER — Ambulatory Visit (INDEPENDENT_AMBULATORY_CARE_PROVIDER_SITE_OTHER): Payer: Federal, State, Local not specified - PPO | Admitting: Physician Assistant

## 2023-03-16 VITALS — BP 140/75 | HR 67 | Temp 98.2°F | Resp 16 | Ht 69.0 in | Wt 211.0 lb

## 2023-03-16 DIAGNOSIS — I739 Peripheral vascular disease, unspecified: Secondary | ICD-10-CM

## 2023-03-16 DIAGNOSIS — I10 Essential (primary) hypertension: Secondary | ICD-10-CM | POA: Diagnosis not present

## 2023-03-16 NOTE — Progress Notes (Signed)
[facility] [address] [place], [place] [address]  Internal MEDICINE  Office Visit Note  Patient Name: Breanna Flores  [id number]  [id number]  Date of Service: 03/16/2023  Chief Complaint  Patient presents with   Follow-up    Lab review   Hyperlipidemia   Hypertension    HPI Pt is here for routine follow -did have visit with vascular and has mild PAD, has 66mo follow up planned -Taking hydralazine once per day most days, has only needed occasional extra tab when BP high -BP after meds at home is typically 138-140 -Will have labs done  Current Medication: Outpatient Encounter Medications as of 03/16/2023  Medication Sig   amLODipine (NORVASC) 10 MG tablet Take 1.5 tabs (15mg ) by mouth daily.   amLODipine (NORVASC) 5 MG tablet Take 1 tablet (5 mg total) by mouth daily.   aspirin EC 81 MG tablet Take 81 mg by mouth daily. Swallow whole.   ezetimibe (ZETIA) 10 MG tablet Take 1 tablet (10 mg total) by mouth daily.   furosemide (LASIX) 20 MG tablet TAKE 1 TABLET BY MOUTH ONCE DAILY AS NEEDED   hydrALAZINE (APRESOLINE) 10 MG tablet Take 1 tablet (10 mg total) by mouth daily.   losartan-hydrochlorothiazide (HYZAAR) 100-25 MG tablet TAKE 1 TABLET BY MOUTH ONCE DAILY   metoprolol succinate (TOPROL-XL) 100 MG 24 hr tablet TAKE 1 TABLET BY MOUTH ONCE DAILY   Multiple Vitamin (MULTIVITAMIN) tablet Take 1 tablet by mouth daily.   NON FORMULARY cpap device   potassium chloride (KLOR-CON) 8 MEQ tablet Take 1 tablet 2 days per week   simvastatin (ZOCOR) 20 MG tablet Take 1 tab po daily   No facility-administered encounter medications on file as of 03/16/2023.    Surgical History: Past Surgical History:  Procedure Laterality Date   COLONOSCOPY WITH PROPOFOL N/A 01/30/2022   Procedure: COLONOSCOPY WITH PROPOFOL;  Surgeon: Midge Minium, MD;  Location: [facility] ENDOSCOPY;  Service: Endoscopy;  Laterality: N/A;   EYE SURGERY  1963, 1978    Medical History: Past Medical  History:  Diagnosis Date   Hyperlipidemia    Hypertension    Sleep apnea     Family History: Family History  Problem Relation Age of Onset   Dementia Mother    Heart murmur Mother    Hypertension Father    Gout Father    Hyperlipidemia Father     Social History   Socioeconomic History   Marital status: Divorced    Spouse name: Not on file   Number of children: Not on file   Years of education: Not on file   Highest education level: Not on file  Occupational History   Not on file  Tobacco Use   Smoking status: Former    Types: Cigarettes    Quit date: 2016    Years since quitting: 8.3   Smokeless tobacco: Never  Substance and Sexual Activity   Alcohol use: Yes    Comment: rarely   Drug use: Never   Sexual activity: Not on file  Other Topics Concern   Not on file  Social History Narrative   Not on file   Social Determinants of Health   Financial Resource Strain: Not on file  Food Insecurity: Not on file  Transportation Needs: Not on file  Physical Activity: Not on file  Stress: Not on file  Social Connections: Not on file  Intimate Partner Violence: Not on file      Review of Systems  Constitutional: Negative.  Negative for chills, fatigue and unexpected weight change.  HENT: Negative.  Negative for congestion, rhinorrhea, sneezing and sore throat.   Respiratory: Negative.  Negative for cough, chest tightness, shortness of breath and wheezing.   Cardiovascular: Negative.  Negative for chest pain and palpitations.  Gastrointestinal:  Negative for abdominal pain, constipation, diarrhea, nausea and vomiting.  Musculoskeletal:  Positive for arthralgias. Negative for back pain, joint swelling and neck pain.  Skin:  Negative for rash.  Neurological: Negative.  Negative for tremors and numbness.  Psychiatric/Behavioral:  Negative for behavioral problems (Depression), sleep disturbance and suicidal ideas. The patient is not nervous/anxious.     Vital  Signs: BP (!) 140/75   Pulse 67   Temp 98.2 F (36.8 C)   Resp 16   Ht 5\' 9"  (1.753 m)   Wt 211 lb (95.7 kg)   SpO2 97%   BMI 31.16 kg/m    Physical Exam Vitals and nursing note reviewed.  Constitutional:      General: She is not in acute distress.    Appearance: She is well-developed. She is obese. She is not diaphoretic.  HENT:     Head: Normocephalic and atraumatic.     Mouth/Throat:     Pharynx: No oropharyngeal exudate.  Eyes:     Pupils: Pupils are equal, round, and reactive to light.  Neck:     Thyroid: No thyromegaly.     Vascular: No JVD.     Trachea: No tracheal deviation.  Cardiovascular:     Rate and Rhythm: Normal rate and regular rhythm.     Heart sounds: Normal heart sounds. No murmur heard.    No friction rub. No gallop.  Pulmonary:     Effort: Pulmonary effort is normal. No respiratory distress.     Breath sounds: No wheezing or rales.  Chest:     Chest wall: No tenderness.  Abdominal:     General: Bowel sounds are normal.     Palpations: Abdomen is soft.  Musculoskeletal:        General: Normal range of motion.     Cervical back: Normal range of motion and neck supple.  Lymphadenopathy:     Cervical: No cervical adenopathy.  Skin:    General: Skin is warm and dry.  Neurological:     Mental Status: She is alert and oriented to person, place, and time.     Cranial Nerves: No cranial nerve deficit.  Psychiatric:        Behavior: Behavior normal.        Thought Content: Thought content normal.        Judgment: Judgment normal.        Assessment/Plan: 1. Essential hypertension Improving, continue current medications  2. PAD (peripheral artery disease) (HCC) Followed by vascular   General Counseling: Emnet verbalizes understanding of the findings of todays visit and agrees with plan of treatment. I have discussed any further diagnostic evaluation that may be needed or ordered today. We also reviewed her medications today. she has been  encouraged to call the office with any questions or concerns that should arise related to todays visit.    No orders of the defined types were placed in this encounter.   No orders of the defined types were placed in this encounter.   This patient was seen by Lynn Ito, PA-C in collaboration with Dr. Beverely Risen as a part of collaborative care agreement.   Total time spent:30 Minutes Time spent includes review of chart, medications, test  results, and follow up plan with the patient.      Dr Lavera Guise Internal medicine

## 2023-04-10 ENCOUNTER — Other Ambulatory Visit: Payer: Self-pay | Admitting: Physician Assistant

## 2023-04-10 DIAGNOSIS — E782 Mixed hyperlipidemia: Secondary | ICD-10-CM

## 2023-04-27 ENCOUNTER — Other Ambulatory Visit: Payer: Self-pay | Admitting: Physician Assistant

## 2023-04-27 DIAGNOSIS — I10 Essential (primary) hypertension: Secondary | ICD-10-CM

## 2023-06-18 ENCOUNTER — Ambulatory Visit: Payer: Federal, State, Local not specified - PPO | Admitting: Physician Assistant

## 2023-07-10 ENCOUNTER — Other Ambulatory Visit: Payer: Self-pay | Admitting: Physician Assistant

## 2023-07-10 DIAGNOSIS — E782 Mixed hyperlipidemia: Secondary | ICD-10-CM

## 2023-07-10 DIAGNOSIS — I10 Essential (primary) hypertension: Secondary | ICD-10-CM

## 2023-07-11 ENCOUNTER — Other Ambulatory Visit: Payer: Self-pay | Admitting: Physician Assistant

## 2023-07-11 DIAGNOSIS — I10 Essential (primary) hypertension: Secondary | ICD-10-CM

## 2023-07-17 ENCOUNTER — Ambulatory Visit: Payer: Federal, State, Local not specified - PPO | Admitting: Physician Assistant

## 2023-08-03 ENCOUNTER — Other Ambulatory Visit: Payer: Self-pay | Admitting: Physician Assistant

## 2023-08-03 DIAGNOSIS — I10 Essential (primary) hypertension: Secondary | ICD-10-CM

## 2023-08-20 ENCOUNTER — Other Ambulatory Visit
Admission: RE | Admit: 2023-08-20 | Discharge: 2023-08-20 | Disposition: A | Discharge status: Home / Self Care | Payer: Federal, State, Local not specified - PPO | Source: Ambulatory Visit | Attending: Physician Assistant | Admitting: Physician Assistant

## 2023-08-20 DIAGNOSIS — R946 Abnormal results of thyroid function studies: Secondary | ICD-10-CM | POA: Insufficient documentation

## 2023-08-20 DIAGNOSIS — R5383 Other fatigue: Secondary | ICD-10-CM | POA: Insufficient documentation

## 2023-08-20 DIAGNOSIS — E782 Mixed hyperlipidemia: Secondary | ICD-10-CM | POA: Diagnosis not present

## 2023-08-20 DIAGNOSIS — E559 Vitamin D deficiency, unspecified: Secondary | ICD-10-CM | POA: Insufficient documentation

## 2023-08-20 DIAGNOSIS — E538 Deficiency of other specified B group vitamins: Secondary | ICD-10-CM | POA: Insufficient documentation

## 2023-08-20 DIAGNOSIS — R7303 Prediabetes: Secondary | ICD-10-CM | POA: Insufficient documentation

## 2023-08-20 LAB — HEMOGLOBIN A1C
Hgb A1c MFr Bld: 5.8 % — ABNORMAL HIGH (ref 4.8–5.6)
Mean Plasma Glucose: 119.76 mg/dL

## 2023-08-20 LAB — CBC WITH DIFFERENTIAL/PLATELET
Abs Immature Granulocytes: 0.02 10*3/uL (ref 0.00–0.07)
Basophils Absolute: 0 10*3/uL (ref 0.0–0.1)
Basophils Relative: 0 %
Eosinophils Absolute: 0.2 10*3/uL (ref 0.0–0.5)
Eosinophils Relative: 3 %
HCT: 47.7 % — ABNORMAL HIGH (ref 36.0–46.0)
Hemoglobin: 15.7 g/dL — ABNORMAL HIGH (ref 12.0–15.0)
Immature Granulocytes: 0 %
Lymphocytes Relative: 26 %
Lymphs Abs: 2.1 10*3/uL (ref 0.7–4.0)
MCH: 29.8 pg (ref 26.0–34.0)
MCHC: 32.9 g/dL (ref 30.0–36.0)
MCV: 90.5 fL (ref 80.0–100.0)
Monocytes Absolute: 0.5 10*3/uL (ref 0.1–1.0)
Monocytes Relative: 6 %
Neutro Abs: 5.2 10*3/uL (ref 1.7–7.7)
Neutrophils Relative %: 65 %
Platelets: 181 10*3/uL (ref 150–400)
RBC: 5.27 MIL/uL — ABNORMAL HIGH (ref 3.87–5.11)
RDW: 13.3 % (ref 11.5–15.5)
WBC: 8 10*3/uL (ref 4.0–10.5)
nRBC: 0 % (ref 0.0–0.2)

## 2023-08-20 LAB — LIPID PANEL
Cholesterol: 107 mg/dL (ref 0–200)
HDL: 52 mg/dL (ref >40)
LDL Cholesterol: 38 mg/dL (ref 0–99)
Total CHOL/HDL Ratio: 2.1 {ratio}
Triglycerides: 86 mg/dL (ref <150)
VLDL: 17 mg/dL (ref 0–40)

## 2023-08-20 LAB — COMPREHENSIVE METABOLIC PANEL
ALT: 16 U/L (ref 0–44)
AST: 25 U/L (ref 15–41)
Albumin: 4.2 g/dL (ref 3.5–5.0)
Alkaline Phosphatase: 61 U/L (ref 38–126)
Anion gap: 10 (ref 5–15)
BUN: 10 mg/dL (ref 8–23)
CO2: 26 mmol/L (ref 22–32)
Calcium: 9.3 mg/dL (ref 8.9–10.3)
Chloride: 100 mmol/L (ref 98–111)
Creatinine, Ser: 0.71 mg/dL (ref 0.44–1.00)
GFR, Estimated: >60 mL/min (ref >60)
Glucose, Bld: 123 mg/dL — ABNORMAL HIGH (ref 70–99)
Potassium: 3.6 mmol/L (ref 3.5–5.1)
Sodium: 136 mmol/L (ref 135–145)
Total Bilirubin: 0.8 mg/dL (ref 0.3–1.2)
Total Protein: 7.4 g/dL (ref 6.5–8.1)

## 2023-08-20 LAB — T4, FREE: Free T4: 0.99 ng/dL (ref 0.61–1.12)

## 2023-08-20 LAB — VITAMIN D 25 HYDROXY (VIT D DEFICIENCY, FRACTURES): Vit D, 25-Hydroxy: 38.34 ng/mL (ref 30–100)

## 2023-08-20 LAB — VITAMIN B12: Vitamin B-12: 529 pg/mL (ref 180–914)

## 2023-08-20 LAB — TSH: TSH: 1.721 u[IU]/mL (ref 0.350–4.500)

## 2023-08-20 LAB — FOLATE: Folate: 35 ng/mL (ref >5.9)

## 2023-08-21 ENCOUNTER — Ambulatory Visit (INDEPENDENT_AMBULATORY_CARE_PROVIDER_SITE_OTHER): Payer: Federal, State, Local not specified - PPO | Admitting: Physician Assistant

## 2023-08-21 ENCOUNTER — Encounter: Payer: Self-pay | Admitting: Physician Assistant

## 2023-08-21 ENCOUNTER — Telehealth: Payer: Self-pay | Admitting: Physician Assistant

## 2023-08-21 VITALS — BP 130/80 | Temp 97.8°F | Resp 16 | Ht 69.0 in | Wt 200.0 lb

## 2023-08-21 DIAGNOSIS — D582 Other hemoglobinopathies: Secondary | ICD-10-CM

## 2023-08-21 DIAGNOSIS — E782 Mixed hyperlipidemia: Secondary | ICD-10-CM | POA: Diagnosis not present

## 2023-08-21 DIAGNOSIS — G4733 Obstructive sleep apnea (adult) (pediatric): Secondary | ICD-10-CM | POA: Diagnosis not present

## 2023-08-21 DIAGNOSIS — I10 Essential (primary) hypertension: Secondary | ICD-10-CM | POA: Diagnosis not present

## 2023-08-21 MED ORDER — HYDRALAZINE HCL 10 MG PO TABS
10.0000 mg | ORAL_TABLET | Freq: Every day | ORAL | 2 refills | Status: DC
Start: 2023-08-21 — End: 2023-11-18

## 2023-08-21 MED ORDER — SIMVASTATIN 20 MG PO TABS
ORAL_TABLET | ORAL | 0 refills | Status: DC
Start: 2023-08-21 — End: 2023-10-06

## 2023-08-21 NOTE — Progress Notes (Signed)
[facility] [address] [place], [place] [address]  Internal MEDICINE  Office Visit Note  Patient Name: Breanna Flores  [id number]  [id number]  Date of Service: 08/26/2023  Chief Complaint  Patient presents with   Follow-up   Hypertension    HPI Pt is here for routine follow up -wearing cpap at night, follows with pulmonology -labs reviewed: A1c improved some--still prediabetic range. -high hemoglobin and pt is already on cpap, will check overnight oximetry. Did discuss if abnormal will likely need titration in order for insurance to cover oxygen, but will start with pulse ox on cpap first -otherwise labs look good and Bp is controlled.  -Mammogram done in feb, colonoscopy UTD, due for pap in Feb  Current Medication: Outpatient Encounter Medications as of 08/21/2023  Medication Sig   amLODipine (NORVASC) 10 MG tablet TAKE 1 TABLET BY MOUTH ONCE DAILY   amLODipine (NORVASC) 5 MG tablet TAKE 1 TABLET BY MOUTH ONCE DAILY   aspirin EC 81 MG tablet Take 81 mg by mouth daily. Swallow whole.   ezetimibe (ZETIA) 10 MG tablet TAKE 1 TABLET BY MOUTH ONCE DAILY   furosemide (LASIX) 20 MG tablet TAKE 1 TABLET BY MOUTH ONCE DAILY AS NEEDED   losartan-hydrochlorothiazide (HYZAAR) 100-25 MG tablet TAKE 1 TABLET BY MOUTH ONCE DAILY   metoprolol succinate (TOPROL-XL) 100 MG 24 hr tablet TAKE 1 TABLET BY MOUTH ONCE DAILY   Multiple Vitamin (MULTIVITAMIN) tablet Take 1 tablet by mouth daily.   NON FORMULARY cpap device   potassium chloride (KLOR-CON) 8 MEQ tablet Take 1 tablet 2 days per week   [DISCONTINUED] hydrALAZINE (APRESOLINE) 10 MG tablet TAKE 1 TABLET BY MOUTH ONCE DAILY   [DISCONTINUED] simvastatin (ZOCOR) 20 MG tablet TAKE 1 TABLET BY MOUTH ONCE DAILY   hydrALAZINE (APRESOLINE) 10 MG tablet Take 1 tablet (10 mg total) by mouth daily.   simvastatin (ZOCOR) 20 MG tablet TAKE 1 TABLET BY MOUTH ONCE DAILY   No facility-administered encounter medications on file as of  08/21/2023.    Surgical History: Past Surgical History:  Procedure Laterality Date   COLONOSCOPY WITH PROPOFOL N/A 01/30/2022   Procedure: COLONOSCOPY WITH PROPOFOL;  Surgeon: Midge Minium, MD;  Location: [facility] ENDOSCOPY;  Service: Endoscopy;  Laterality: N/A;   EYE SURGERY  1963, 1978    Medical History: Past Medical History:  Diagnosis Date   Hyperlipidemia    Hypertension    Sleep apnea     Family History: Family History  Problem Relation Age of Onset   Dementia Mother    Heart murmur Mother    Hypertension Father    Gout Father    Hyperlipidemia Father     Social History   Socioeconomic History   Marital status: Divorced    Spouse name: Not on file   Number of children: Not on file   Years of education: Not on file   Highest education level: Not on file  Occupational History   Not on file  Tobacco Use   Smoking status: Former    Current packs/day: 0.00    Types: Cigarettes    Quit date: 2016    Years since quitting: 8.7   Smokeless tobacco: Never  Substance and Sexual Activity   Alcohol use: Yes    Comment: rarely   Drug use: Never   Sexual activity: Not on file  Other Topics Concern   Not on file  Social History Narrative   Not on file   Social Determinants of Health  Financial Resource Strain: Not on file  Food Insecurity: Not on file  Transportation Needs: Not on file  Physical Activity: Not on file  Stress: Not on file  Social Connections: Not on file  Intimate Partner Violence: Not on file      Review of Systems  Constitutional: Negative.  Negative for chills, fatigue and unexpected weight change.  HENT: Negative.  Negative for congestion, rhinorrhea, sneezing and sore throat.   Respiratory: Negative.  Negative for cough, chest tightness, shortness of breath and wheezing.   Cardiovascular: Negative.  Negative for chest pain and palpitations.  Gastrointestinal:  Negative for abdominal pain, constipation, diarrhea, nausea and vomiting.   Musculoskeletal:  Positive for arthralgias. Negative for back pain, joint swelling and neck pain.  Skin:  Negative for rash.  Neurological: Negative.  Negative for tremors and numbness.  Psychiatric/Behavioral:  Negative for behavioral problems (Depression), sleep disturbance and suicidal ideas. The patient is not nervous/anxious.     Vital Signs: BP 130/80   Temp 97.8 F (36.6 C)   Resp 16   Ht 5\' 9"  (1.753 m)   Wt 200 lb (90.7 kg)   SpO2 94%   BMI 29.53 kg/m    Physical Exam Vitals and nursing note reviewed.  Constitutional:      General: She is not in acute distress.    Appearance: She is well-developed. She is obese. She is not diaphoretic.  HENT:     Head: Normocephalic and atraumatic.     Mouth/Throat:     Pharynx: No oropharyngeal exudate.  Eyes:     Pupils: Pupils are equal, round, and reactive to light.  Neck:     Thyroid: No thyromegaly.     Vascular: No JVD.     Trachea: No tracheal deviation.  Cardiovascular:     Rate and Rhythm: Normal rate and regular rhythm.     Heart sounds: Normal heart sounds. No murmur heard.    No friction rub. No gallop.  Pulmonary:     Effort: Pulmonary effort is normal. No respiratory distress.     Breath sounds: No wheezing or rales.  Chest:     Chest wall: No tenderness.  Abdominal:     General: Bowel sounds are normal.     Palpations: Abdomen is soft.  Musculoskeletal:        General: Normal range of motion.     Cervical back: Normal range of motion and neck supple.  Lymphadenopathy:     Cervical: No cervical adenopathy.  Skin:    General: Skin is warm and dry.  Neurological:     Mental Status: She is alert and oriented to person, place, and time.     Cranial Nerves: No cranial nerve deficit.  Psychiatric:        Behavior: Behavior normal.        Thought Content: Thought content normal.        Judgment: Judgment normal.        Assessment/Plan: 1. Essential hypertension Continue current medications -  hydrALAZINE (APRESOLINE) 10 MG tablet; Take 1 tablet (10 mg total) by mouth daily.  Dispense: 30 tablet; Refill: 2  2. Mixed hyperlipidemia Stable, continue current medications - simvastatin (ZOCOR) 20 MG tablet; TAKE 1 TABLET BY MOUTH ONCE DAILY  Dispense: 90 tablet; Refill: 0  3. Elevated hemoglobin (HCC) Will check pulse ox on cpap - Pulse oximetry, overnight; Future  4. OSA (obstructive sleep apnea) Check pulse ox on cpap - Pulse oximetry, overnight; Future   General Counseling: Sue Lush  verbalizes understanding of the findings of todays visit and agrees with plan of treatment. I have discussed any further diagnostic evaluation that may be needed or ordered today. We also reviewed her medications today. she has been encouraged to call the office with any questions or concerns that should arise related to todays visit.    Orders Placed This Encounter  Procedures   Pulse oximetry, overnight    Meds ordered this encounter  Medications   hydrALAZINE (APRESOLINE) 10 MG tablet    Sig: Take 1 tablet (10 mg total) by mouth daily.    Dispense:  30 tablet    Refill:  2   simvastatin (ZOCOR) 20 MG tablet    Sig: TAKE 1 TABLET BY MOUTH ONCE DAILY    Dispense:  90 tablet    Refill:  0    This patient was seen by Lynn Ito, PA-C in collaboration with Dr. Beverely Risen as a part of collaborative care agreement.   Total time spent:30 Minutes Time spent includes review of chart, medications, test results, and follow up plan with the patient.      Dr Lyndon Code Internal medicine

## 2023-08-21 NOTE — Telephone Encounter (Signed)
Notified [facility] [employer] of overnight pulseox on cpap order-Toni

## 2023-09-09 ENCOUNTER — Telehealth: Payer: Self-pay | Admitting: Physician Assistant

## 2023-09-09 NOTE — Telephone Encounter (Signed)
Received call from [place] with [employer]. Patient has not responded to phone calls for the overnight pulseOx. They have even driven to residence. I lvm to patient to call me back asap-Toni

## 2023-09-11 ENCOUNTER — Telehealth: Payer: Self-pay | Admitting: Physician Assistant

## 2023-09-11 NOTE — Telephone Encounter (Addendum)
Patient picked up pulseox from office. I notified Kelly with [employer]-Toni

## 2023-09-14 ENCOUNTER — Encounter (INDEPENDENT_AMBULATORY_CARE_PROVIDER_SITE_OTHER): Payer: Federal, State, Local not specified - PPO

## 2023-09-14 ENCOUNTER — Ambulatory Visit (INDEPENDENT_AMBULATORY_CARE_PROVIDER_SITE_OTHER): Payer: Federal, State, Local not specified - PPO | Admitting: Vascular Surgery

## 2023-09-16 ENCOUNTER — Telehealth: Payer: Self-pay | Admitting: Physician Assistant

## 2023-09-16 NOTE — Telephone Encounter (Signed)
Received [employer] results. Gave to [employer]

## 2023-09-17 ENCOUNTER — Telehealth: Payer: Self-pay

## 2023-09-17 NOTE — Telephone Encounter (Signed)
Notified patient that overnight oxymetry was normal.

## 2023-09-25 ENCOUNTER — Other Ambulatory Visit (INDEPENDENT_AMBULATORY_CARE_PROVIDER_SITE_OTHER): Payer: Self-pay | Admitting: Vascular Surgery

## 2023-09-25 DIAGNOSIS — I70213 Atherosclerosis of native arteries of extremities with intermittent claudication, bilateral legs: Secondary | ICD-10-CM

## 2023-10-01 ENCOUNTER — Ambulatory Visit (INDEPENDENT_AMBULATORY_CARE_PROVIDER_SITE_OTHER): Payer: Federal, State, Local not specified - PPO

## 2023-10-01 ENCOUNTER — Encounter (INDEPENDENT_AMBULATORY_CARE_PROVIDER_SITE_OTHER): Payer: Self-pay | Admitting: Vascular Surgery

## 2023-10-01 ENCOUNTER — Ambulatory Visit (INDEPENDENT_AMBULATORY_CARE_PROVIDER_SITE_OTHER): Payer: Federal, State, Local not specified - PPO | Admitting: Vascular Surgery

## 2023-10-01 VITALS — BP 154/79 | HR 63 | Resp 18 | Ht 69.0 in | Wt 198.8 lb

## 2023-10-01 DIAGNOSIS — E782 Mixed hyperlipidemia: Secondary | ICD-10-CM | POA: Diagnosis not present

## 2023-10-01 DIAGNOSIS — I10 Essential (primary) hypertension: Secondary | ICD-10-CM

## 2023-10-01 DIAGNOSIS — J4489 Other specified chronic obstructive pulmonary disease: Secondary | ICD-10-CM | POA: Diagnosis not present

## 2023-10-01 DIAGNOSIS — I70213 Atherosclerosis of native arteries of extremities with intermittent claudication, bilateral legs: Secondary | ICD-10-CM | POA: Diagnosis not present

## 2023-10-01 LAB — VAS US ABI WITH/WO TBI
Left ABI: 0.85
Right ABI: 0.78

## 2023-10-02 ENCOUNTER — Encounter (INDEPENDENT_AMBULATORY_CARE_PROVIDER_SITE_OTHER): Payer: Self-pay | Admitting: Vascular Surgery

## 2023-10-02 NOTE — Progress Notes (Signed)
MRN : [id number]  Breanna Flores is a 62 y.o. (13-Feb-1961) female who presents with chief complaint of check circulation.  History of Present Illness:   The patient returns to the office for followup and review of the noninvasive studies.    There have been no interval changes in lower extremity symptoms. No interval shortening of the patient's claudication distance or development of rest pain symptoms. No new ulcers or wounds have occurred since the last visit.   There have been no significant changes to the patient's overall health care.   The patient denies amaurosis fugax or recent TIA symptoms. There are no documented recent neurological changes noted. There is no history of DVT, PE or superficial thrombophlebitis. The patient denies recent episodes of angina or shortness of breath.    ABI's today show Rt=0.78 and Lt=0.85  (previous ABI's Rt=0.74 and Lt=0.79)   Current Meds  Medication Sig   amLODipine (NORVASC) 10 MG tablet TAKE 1 TABLET BY MOUTH ONCE DAILY   amLODipine (NORVASC) 5 MG tablet TAKE 1 TABLET BY MOUTH ONCE DAILY   aspirin EC 81 MG tablet Take 81 mg by mouth daily. Swallow whole.   ezetimibe (ZETIA) 10 MG tablet TAKE 1 TABLET BY MOUTH ONCE DAILY   furosemide (LASIX) 20 MG tablet TAKE 1 TABLET BY MOUTH ONCE DAILY AS NEEDED   hydrALAZINE (APRESOLINE) 10 MG tablet Take 1 tablet (10 mg total) by mouth daily.   losartan-hydrochlorothiazide (HYZAAR) 100-25 MG tablet TAKE 1 TABLET BY MOUTH ONCE DAILY   metoprolol succinate (TOPROL-XL) 100 MG 24 hr tablet TAKE 1 TABLET BY MOUTH ONCE DAILY   Multiple Vitamin (MULTIVITAMIN) tablet Take 1 tablet by mouth daily.   NON FORMULARY cpap device   potassium chloride (KLOR-CON) 8 MEQ tablet Take 1 tablet 2 days per week   simvastatin (ZOCOR) 20 MG tablet TAKE 1 TABLET BY MOUTH ONCE DAILY    Past Medical History:  Diagnosis Date   Hyperlipidemia     Hypertension    Sleep apnea     Past Surgical History:  Procedure Laterality Date   COLONOSCOPY WITH PROPOFOL N/A 01/30/2022   Procedure: COLONOSCOPY WITH PROPOFOL;  Surgeon: Midge Minium, MD;  Location: [facility] ENDOSCOPY;  Service: Endoscopy;  Laterality: N/A;   EYE SURGERY  1963, 1978    Social History Social History   Tobacco Use   Smoking status: Former    Current packs/day: 0.00    Types: Cigarettes    Quit date: 2016    Years since quitting: 8.8   Smokeless tobacco: Never  Substance Use Topics   Alcohol use: Yes    Comment: rarely   Drug use: Never    Family History Family History  Problem Relation Age of Onset   Dementia Mother    Heart murmur Mother    Hypertension Father    Gout Father    Hyperlipidemia Father     Allergies  Allergen Reactions   Erythromycin Rash   Penicillins Rash   Tape Rash    Specifically ACE wrap      REVIEW OF SYSTEMS (Negative unless checked)  Constitutional: []   Weight loss  [] Fever  [] Chills Cardiac: [] Chest pain   [] Chest pressure   [] Palpitations   [] Shortness of breath when laying flat   [] Shortness of breath with exertion. Vascular:  [x] Pain in legs with walking   [] Pain in legs at rest  [] History of DVT   [] Phlebitis   [] Swelling in legs   [] Varicose veins   [] Non-healing ulcers Pulmonary:   [] Uses home oxygen   [] Productive cough   [] Hemoptysis   [] Wheeze  [] COPD   [] Asthma Neurologic:  [] Dizziness   [] Seizures   [] History of stroke   [] History of TIA  [] Aphasia   [] Vissual changes   [] Weakness or numbness in arm   [] Weakness or numbness in leg Musculoskeletal:   [] Joint swelling   [] Joint pain   [] Low back pain Hematologic:  [] Easy bruising  [] Easy bleeding   [] Hypercoagulable state   [] Anemic Gastrointestinal:  [] Diarrhea   [] Vomiting  [] Gastroesophageal reflux/heartburn   [] Difficulty swallowing. Genitourinary:  [] Chronic kidney disease   [] Difficult urination  [] Frequent urination   [] Blood in urine Skin:  [] Rashes    [] Ulcers  Psychological:  [] History of anxiety   []  History of major depression.  Physical Examination  Vitals:   10/01/23 1525  BP: (!) 154/79  Pulse: 63  Resp: 18  Weight: 198 lb 12.8 oz (90.2 kg)  Height: 5\' 9"  (1.753 m)   Body mass index is 29.36 kg/m. Gen: WD/WN, NAD Head: [place]/AT, No temporalis wasting.  Ear/Nose/Throat: Hearing grossly intact, nares w/o erythema or drainage Eyes: PER, EOMI, sclera nonicteric.  Neck: Supple, no masses.  No bruit or JVD.  Pulmonary:  Good air movement, no audible wheezing, no use of accessory muscles.  Cardiac: RRR, normal S1, S2, no Murmurs. Vascular:  mild trophic changes, no open wounds Vessel Right Left  Radial Palpable Palpable  PT Not Palpable Not Palpable  DP Not Palpable Not Palpable  Gastrointestinal: soft, non-distended. No guarding/no peritoneal signs.  Musculoskeletal: M/S 5/5 throughout.  No visible deformity.  Neurologic: CN 2-12 intact. Pain and light touch intact in extremities.  Symmetrical.  Speech is fluent. Motor exam as listed above. Psychiatric: Judgment intact, Mood & affect appropriate for pt's clinical situation. Dermatologic: No rashes or ulcers noted.  No changes consistent with cellulitis.   CBC Lab Results  Component Value Date   WBC 8.0 08/20/2023   HGB 15.7 (H) 08/20/2023   HCT 47.7 (H) 08/20/2023   MCV 90.5 08/20/2023   PLT 181 08/20/2023    BMET    Component Value Date/Time   NA 136 08/20/2023 0843   K 3.6 08/20/2023 0843   CL 100 08/20/2023 0843   CO2 26 08/20/2023 0843   GLUCOSE 123 (H) 08/20/2023 0843   BUN 10 08/20/2023 0843   CREATININE 0.71 08/20/2023 0843   CALCIUM 9.3 08/20/2023 0843   GFRNONAA >60 08/20/2023 0843   GFRAA >60 04/19/2020 1018   CrCl cannot be calculated (Patient's most recent lab result is older than the maximum 21 days allowed.).  COAG No results found for: "INR", "PROTIME"  Radiology VAS [place] ABI WITH/WO TBI  Result Date: 10/01/2023  LOWER EXTREMITY DOPPLER  STUDY Patient Name:  Breanna Flores  Date of Exam:   10/01/2023 Medical Rec #: [id number]       Accession #:    [id number] Date of Birth: 1961/07/30        Patient Gender: F Patient Age:   68 years Exam Location:  [facility] Procedure:      VAS [place]  ABI WITH/WO TBI Referring Phys: [facility] --------------------------------------------------------------------------------  Indications: Claudication, and peripheral artery disease. High Risk Factors: Hyperlipidemia, past history of smoking.  Performing Technologist: Hardie Lora RVT  Examination Guidelines: A complete evaluation includes at minimum, Doppler waveform signals and systolic blood pressure reading at the level of bilateral brachial, anterior tibial, and posterior tibial arteries, when vessel segments are accessible. Bilateral testing is considered an integral part of a complete examination. Photoelectric Plethysmograph (PPG) waveforms and toe systolic pressure readings are included as required and additional duplex testing as needed. Limited examinations for reoccurring indications may be performed as noted.  ABI Findings: +---------+------------------+-----+--------+--------+ Right    Rt Pressure (mmHg)IndexWaveformComment  +---------+------------------+-----+--------+--------+ Brachial 133                                     +---------+------------------+-----+--------+--------+ PTA      104               0.78 biphasic         +---------+------------------+-----+--------+--------+ DP       97                0.72 biphasic         +---------+------------------+-----+--------+--------+ Great Toe66                0.49                  +---------+------------------+-----+--------+--------+ +---------+------------------+-----+----------+-------+ Left     Lt Pressure (mmHg)IndexWaveform  Comment +---------+------------------+-----+----------+-------+ Brachial 134                                       +---------+------------------+-----+----------+-------+ PTA      114               0.85 triphasic         +---------+------------------+-----+----------+-------+ DP       102               0.76 monophasic        +---------+------------------+-----+----------+-------+ Great Toe78                0.58                   +---------+------------------+-----+----------+-------+ +-------+-----------+-----------+------------+------------+ ABI/TBIToday's ABIToday's TBIPrevious ABIPrevious TBI +-------+-----------+-----------+------------+------------+ Right  0.78       0.49       0.74        0.84         +-------+-----------+-----------+------------+------------+ Left   0.85       0.58       0.79        0.78         +-------+-----------+-----------+------------+------------+ Bilateral ABIs appear essentially unchanged compared to prior study on 03/12/2023.  Summary: Right: Resting right ankle-brachial index indicates moderate right lower extremity arterial disease. The right toe-brachial index is abnormal. Left: Resting left ankle-brachial index indicates mild left lower extremity arterial disease. The left toe-brachial index is abnormal. *See table(s) above for measurements and observations.  Electronically signed by Levora Dredge MD on 10/01/2023 at 5:42:03 PM.    Final      Assessment/Plan 1. Atherosclerosis of native artery of both lower extremities with intermittent claudication (HCC)  Recommend:  The patient has evidence of atherosclerosis of the lower extremities with claudication.  The patient does not voice lifestyle  limiting changes at this point in time.  Noninvasive studies do not suggest clinically significant change.  No invasive studies, angiography or surgery at this time The patient should continue walking and begin a more formal exercise program.  The patient should continue antiplatelet therapy and aggressive treatment of the lipid abnormalities  No  changes in the patient's medications at this time  Continued surveillance is indicated as atherosclerosis is likely to progress with time.    The patient will continue follow up with noninvasive studies as ordered.  - VAS [place] ABI WITH/WO TBI; Future  2. Essential hypertension Continue antihypertensive medications as already ordered, these medications have been reviewed and there are no changes at this time.  3. Obstructive chronic bronchitis without exacerbation (HCC) Continue pulmonary medications and aerosols as already ordered, these medications have been reviewed and there are no changes at this time.   4. Mixed hyperlipidemia Continue statin as ordered and reviewed, no changes at this time    Levora Dredge, MD  10/02/2023 7:53 AM

## 2023-10-06 ENCOUNTER — Other Ambulatory Visit: Payer: Self-pay | Admitting: Physician Assistant

## 2023-10-06 DIAGNOSIS — I10 Essential (primary) hypertension: Secondary | ICD-10-CM

## 2023-10-06 DIAGNOSIS — E782 Mixed hyperlipidemia: Secondary | ICD-10-CM

## 2023-11-18 ENCOUNTER — Other Ambulatory Visit: Payer: Self-pay | Admitting: Physician Assistant

## 2023-11-18 DIAGNOSIS — I10 Essential (primary) hypertension: Secondary | ICD-10-CM

## 2023-11-26 ENCOUNTER — Other Ambulatory Visit: Payer: Self-pay | Admitting: Medical Genetics

## 2023-12-15 ENCOUNTER — Other Ambulatory Visit: Payer: Self-pay | Admitting: Physician Assistant

## 2023-12-15 DIAGNOSIS — Z1231 Encounter for screening mammogram for malignant neoplasm of breast: Secondary | ICD-10-CM

## 2024-01-08 ENCOUNTER — Ambulatory Visit
Admission: RE | Admit: 2024-01-08 | Discharge: 2024-01-08 | Disposition: A | Discharge status: Home / Self Care | Payer: Medicare Other | Source: Ambulatory Visit | Attending: Physician Assistant | Admitting: Physician Assistant

## 2024-01-08 DIAGNOSIS — Z1231 Encounter for screening mammogram for malignant neoplasm of breast: Secondary | ICD-10-CM | POA: Diagnosis present

## 2024-01-12 ENCOUNTER — Other Ambulatory Visit: Payer: Self-pay | Admitting: Physician Assistant

## 2024-01-12 DIAGNOSIS — I10 Essential (primary) hypertension: Secondary | ICD-10-CM

## 2024-01-12 DIAGNOSIS — E782 Mixed hyperlipidemia: Secondary | ICD-10-CM

## 2024-01-12 NOTE — Telephone Encounter (Signed)
 Please review for amlodipine she is on multiple BP med

## 2024-01-27 ENCOUNTER — Encounter: Payer: Self-pay | Admitting: Nurse Practitioner

## 2024-01-27 ENCOUNTER — Ambulatory Visit (INDEPENDENT_AMBULATORY_CARE_PROVIDER_SITE_OTHER): Payer: Federal, State, Local not specified - PPO | Admitting: Nurse Practitioner

## 2024-01-27 VITALS — BP 144/78 | HR 69 | Temp 98.2°F | Resp 16 | Ht 69.0 in | Wt 208.8 lb

## 2024-01-27 DIAGNOSIS — G4733 Obstructive sleep apnea (adult) (pediatric): Secondary | ICD-10-CM | POA: Diagnosis not present

## 2024-01-27 DIAGNOSIS — Z87891 Personal history of nicotine dependence: Secondary | ICD-10-CM | POA: Diagnosis not present

## 2024-01-27 DIAGNOSIS — J4489 Other specified chronic obstructive pulmonary disease: Secondary | ICD-10-CM | POA: Diagnosis not present

## 2024-01-27 NOTE — Progress Notes (Signed)
 [facility] [address] [place], [place] [address]  Internal MEDICINE  Office Visit Note  Patient Name: Breanna Flores  [id number]  [id number]  Date of Service: 01/27/2024  Chief Complaint  Patient presents with   Follow-up    HPI [place] presents for a follow-up visit for COPD, OSA and former smoker.  COPD -- has some allergies that bother her, not taking anything for allergies. Has not needed any rescue or maintenance inhalers.  OSA on CPAP -- no issues, using CPAP as instructed.  Stopped smoking 2 years ago now, doing well     Current Medication: Outpatient Encounter Medications as of 01/27/2024  Medication Sig   amLODipine  (NORVASC ) 10 MG tablet TAKE 1 TABLET BY MOUTH ONCE DAILY   amLODipine  (NORVASC ) 5 MG tablet TAKE 1 TABLET BY MOUTH ONCE DAILY   aspirin EC 81 MG tablet Take 81 mg by mouth daily. Swallow whole.   ezetimibe  (ZETIA ) 10 MG tablet TAKE 1 TABLET BY MOUTH ONCE DAILY   furosemide  (LASIX ) 20 MG tablet TAKE 1 TABLET BY MOUTH ONCE DAILY AS NEEDED   hydrALAZINE  (APRESOLINE ) 10 MG tablet TAKE 1 TABLET BY MOUTH ONCE DAILY   losartan -hydrochlorothiazide (HYZAAR) 100-25 MG tablet TAKE 1 TABLET BY MOUTH ONCE DAILY   metoprolol  succinate (TOPROL -XL) 100 MG 24 hr tablet TAKE 1 TABLET BY MOUTH ONCE DAILY   Multiple Vitamin (MULTIVITAMIN) tablet Take 1 tablet by mouth daily.   NON FORMULARY cpap device   potassium chloride  (KLOR-CON ) 8 MEQ tablet TAKE 1 TABLET BY MOUTH 2 DAYS PER WEEK AS DIRECTED   simvastatin  (ZOCOR ) 20 MG tablet TAKE 1 TABLET BY MOUTH ONCE DAILY   No facility-administered encounter medications on file as of 01/27/2024.    Surgical History: Past Surgical History:  Procedure Laterality Date   COLONOSCOPY WITH PROPOFOL  N/A 01/30/2022   Procedure: COLONOSCOPY WITH PROPOFOL ;  Surgeon: Marnee Sink, MD;  Location: [facility] ENDOSCOPY;  Service: Endoscopy;  Laterality: N/A;   EYE SURGERY  1963, 1978    Medical History: Past Medical History:   Diagnosis Date   Hyperlipidemia    Hypertension    Sleep apnea     Family History: Family History  Problem Relation Age of Onset   Dementia Mother    Heart murmur Mother    Hypertension Father    Gout Father    Hyperlipidemia Father     Social History   Socioeconomic History   Marital status: Divorced    Spouse name: Not on file   Number of children: Not on file   Years of education: Not on file   Highest education level: Not on file  Occupational History   Not on file  Tobacco Use   Smoking status: Former    Current packs/day: 0.00    Types: Cigarettes    Quit date: 2016    Years since quitting: 9.2   Smokeless tobacco: Never  Substance and Sexual Activity   Alcohol use: Not Currently    Comment: rarely   Drug use: Never   Sexual activity: Not on file  Other Topics Concern   Not on file  Social History Narrative   Not on file   Social Drivers of Health   Financial Resource Strain: Not on file  Food Insecurity: Not on file  Transportation Needs: Not on file  Physical Activity: Not on file  Stress: Not on file  Social Connections: Not on file  Intimate Partner Violence: Not on file      Review of Systems  Constitutional: Negative.  Negative for chills, fatigue and unexpected weight change.  HENT: Negative.  Negative for congestion, rhinorrhea, sneezing and sore throat.   Respiratory: Negative.  Negative for cough, chest tightness, shortness of breath and wheezing.   Cardiovascular: Negative.  Negative for chest pain and palpitations.  Gastrointestinal:  Negative for abdominal pain, constipation, diarrhea, nausea and vomiting.  Musculoskeletal: Negative.  Negative for arthralgias, back pain, joint swelling and neck pain.  Skin:  Negative for rash.  Neurological: Negative.  Negative for tremors and numbness.  Psychiatric/Behavioral:  Negative for behavioral problems (Depression), sleep disturbance and suicidal ideas. The patient is not nervous/anxious.      Vital Signs: BP (!) 144/78   Pulse 69   Temp 98.2 F (36.8 C)   Resp 16   Ht 5\' 9"  (1.753 m)   Wt 208 lb 12.8 oz (94.7 kg)   SpO2 98%   BMI 30.83 kg/m    Physical Exam Vitals reviewed.  Constitutional:      General: She is not in acute distress.    Appearance: Normal appearance. She is obese. She is not ill-appearing.  HENT:     Head: Normocephalic and atraumatic.  Eyes:     Pupils: Pupils are equal, round, and reactive to light.  Cardiovascular:     Rate and Rhythm: Normal rate and regular rhythm.     Heart sounds: No murmur heard.    No friction rub. No gallop.  Pulmonary:     Effort: Pulmonary effort is normal. No respiratory distress.     Breath sounds: Normal breath sounds. No wheezing.  Neurological:     Mental Status: She is alert and oriented to person, place, and time.  Psychiatric:        Mood and Affect: Mood normal.        Behavior: Behavior normal.        Assessment/Plan: 1. Obstructive chronic bronchitis without exacerbation (HCC) (Primary) PFT ordered, will follow up to discuss results. Not currently on any maintenance inhalers.  - Pulmonary function test; Future  2. OSA (obstructive sleep apnea) Continue CPAP use as instructed.   3. Stopped smoking more than 1 year ago [place] job, continue abstaining from smoking.    General Counseling: Yurie verbalizes understanding of the findings of todays visit and agrees with plan of treatment. I have discussed any further diagnostic evaluation that may be needed or ordered today. We also reviewed her medications today. she has been encouraged to call the office with any questions or concerns that should arise related to todays visit.    Orders Placed This Encounter  Procedures   Pulmonary function test    No orders of the defined types were placed in this encounter.   Return in about 1 year (around 01/26/2025) for F/U, pulmonary/sleep, Kenedy Haisley and may f/u for results of PFT with Dejon Lukas or  lauren.   Total time spent:30 Minutes Time spent includes review of chart, medications, test results, and follow up plan with the patient.   [place] Controlled Substance Database was reviewed by me.  This patient was seen by Laurence Pons, FNP-C in collaboration with Dr. Verneta Gone as a part of collaborative care agreement.   Taronda Comacho R. Bobbi Burow, MSN, FNP-C Internal medicine

## 2024-01-29 ENCOUNTER — Other Ambulatory Visit: Payer: Self-pay | Admitting: Physician Assistant

## 2024-01-29 DIAGNOSIS — I10 Essential (primary) hypertension: Secondary | ICD-10-CM

## 2024-02-05 ENCOUNTER — Other Ambulatory Visit: Payer: Federal, State, Local not specified - PPO | Admitting: Physician Assistant

## 2024-02-11 ENCOUNTER — Telehealth: Payer: Self-pay | Admitting: Physician Assistant

## 2024-02-11 NOTE — Telephone Encounter (Signed)
 Left vm and sent mychart message to confirm 02/18/24 appointment-Toni

## 2024-02-18 ENCOUNTER — Encounter: Payer: Self-pay | Admitting: Physician Assistant

## 2024-02-18 ENCOUNTER — Ambulatory Visit (INDEPENDENT_AMBULATORY_CARE_PROVIDER_SITE_OTHER): Admitting: Physician Assistant

## 2024-02-18 VITALS — BP 123/70 | HR 75 | Temp 98.2°F | Resp 16 | Ht 69.0 in | Wt 207.2 lb

## 2024-02-18 DIAGNOSIS — Z0000 Encounter for general adult medical examination without abnormal findings: Secondary | ICD-10-CM

## 2024-02-18 DIAGNOSIS — E2839 Other primary ovarian failure: Secondary | ICD-10-CM

## 2024-02-18 DIAGNOSIS — I10 Essential (primary) hypertension: Secondary | ICD-10-CM | POA: Diagnosis not present

## 2024-02-18 DIAGNOSIS — Z124 Encounter for screening for malignant neoplasm of cervix: Secondary | ICD-10-CM | POA: Diagnosis not present

## 2024-02-18 DIAGNOSIS — Z113 Encounter for screening for infections with a predominantly sexual mode of transmission: Secondary | ICD-10-CM

## 2024-02-18 DIAGNOSIS — Z87891 Personal history of nicotine dependence: Secondary | ICD-10-CM

## 2024-02-18 NOTE — Progress Notes (Signed)
 [name] [address] [place], [place] [address]  Internal MEDICINE  Office Visit Note  Patient Name: Breanna Flores  [id number]  [id number]  Date of Service: 02/18/2024  Chief Complaint  Patient presents with   Medicare Wellness   Hyperlipidemia   Hypertension   Quality Metric Gaps    DEXA scan and Pneumonia vaccine    HPI Breanna Flores presents for an annual well visit.  Well-appearing 63 y.o.female Routine CRC screening: Done in 2023 Routine mammogram: UTD, done in Feb DEXA scan: Due and will order Pap smear: due today Labs: previously reviewed -wearing cpap nightly -follow up with vascular went well, Will be having annual follow up -consider echo in future for follow up -was smoking 3ppd in college, then 1ppd for a few decades, then 1/2 ppd until 2016 when she quit. Will orrderr CT lung screening -BP well controlled     02/18/2024    2:01 PM  MMSE - Mini Mental State Exam  Orientation to time 5  Orientation to Place 5  Registration 3  Attention/ Calculation 5  Recall 3  Language- name 2 objects 2  Language- repeat 1  Language- follow 3 step command 3  Language- read & follow direction 1  Write a sentence 1  Copy design 1  Total score 30    Functional Status Survey: Is the patient deaf or have difficulty hearing?: No Does the patient have difficulty seeing, even when wearing glasses/contacts?: No Does the patient have difficulty concentrating, remembering, or making decisions?: No Does the patient have difficulty walking or climbing stairs?: No Does the patient have difficulty dressing or bathing?: No Does the patient have difficulty doing errands alone such as visiting a doctor's office or shopping?: No     07/04/2022    9:28 AM 01/27/2023    9:33 AM 01/30/2023   11:36 AM 08/21/2023    8:31 AM 02/18/2024    2:00 PM  Fall Risk  Falls in the past year? 0 0 0 0 0  Was there an injury with Fall?  0     Fall Risk Category Calculator  0      Patient at Risk for Falls Due to  No Fall Risks  No Fall Risks   Fall risk Follow up  Falls evaluation completed  Falls evaluation completed        02/18/2024    2:00 PM  Depression screen PHQ 2/9  Decreased Interest 0  Down, Depressed, Hopeless 0  PHQ - 2 Score 0        No data to display            Current Medication: Outpatient Encounter Medications as of 02/18/2024  Medication Sig   amLODipine  (NORVASC ) 10 MG tablet TAKE 1 TABLET BY MOUTH ONCE DAILY   amLODipine  (NORVASC ) 5 MG tablet TAKE 1 TABLET BY MOUTH ONCE DAILY   aspirin EC 81 MG tablet Take 81 mg by mouth daily. Swallow whole.   ezetimibe  (ZETIA ) 10 MG tablet TAKE 1 TABLET BY MOUTH ONCE DAILY   furosemide  (LASIX ) 20 MG tablet TAKE 1 TABLET BY MOUTH ONCE DAILY AS NEEDED   hydrALAZINE  (APRESOLINE ) 10 MG tablet TAKE 1 TABLET BY MOUTH ONCE DAILY   losartan -hydrochlorothiazide (HYZAAR) 100-25 MG tablet TAKE 1 TABLET BY MOUTH ONCE DAILY   metoprolol  succinate (TOPROL -XL) 100 MG 24 hr tablet TAKE 1 TABLET BY MOUTH ONCE DAILY   Multiple Vitamin (MULTIVITAMIN) tablet Take 1 tablet by mouth daily.   NON FORMULARY cpap device  potassium chloride  (KLOR-CON ) 8 MEQ tablet TAKE 1 TABLET BY MOUTH 2 DAYS PER WEEK AS DIRECTED   simvastatin  (ZOCOR ) 20 MG tablet TAKE 1 TABLET BY MOUTH ONCE DAILY   No facility-administered encounter medications on file as of 02/18/2024.    Surgical History: Past Surgical History:  Procedure Laterality Date   COLONOSCOPY WITH PROPOFOL  N/A 01/30/2022   Procedure: COLONOSCOPY WITH PROPOFOL ;  Surgeon: Marnee Sink, MD;  Location: [facility] ENDOSCOPY;  Service: Endoscopy;  Laterality: N/A;   EYE SURGERY  1963, 1978    Medical History: Past Medical History:  Diagnosis Date   Hyperlipidemia    Hypertension    Sleep apnea     Family History: Family History  Problem Relation Age of Onset   Dementia Mother    Heart murmur Mother    Hypertension Father    Gout Father    Hyperlipidemia Father      Social History   Socioeconomic History   Marital status: Divorced    Spouse name: Not on file   Number of children: Not on file   Years of education: Not on file   Highest education level: Not on file  Occupational History   Not on file  Tobacco Use   Smoking status: Former    Current packs/day: 0.00    Average packs/day: 1 pack/day for 37.9 years (37.9 ttl pk-yrs)    Types: Cigarettes    Start date: 11/29/1976    Quit date: 2016    Years since quitting: 9.3   Smokeless tobacco: Never  Substance and Sexual Activity   Alcohol use: Not Currently    Comment: rarely   Drug use: Never   Sexual activity: Not on file  Other Topics Concern   Not on file  Social History Narrative   Not on file   Social Drivers of Health   Financial Resource Strain: Not on file  Food Insecurity: Not on file  Transportation Needs: Not on file  Physical Activity: Not on file  Stress: Not on file  Social Connections: Not on file  Intimate Partner Violence: Not on file      Review of Systems  Constitutional: Negative.  Negative for chills, fatigue and unexpected weight change.  HENT: Negative.  Negative for congestion, rhinorrhea, sneezing and sore throat.   Respiratory: Negative.  Negative for cough, chest tightness, shortness of breath and wheezing.   Cardiovascular: Negative.  Negative for chest pain and palpitations.  Gastrointestinal:  Negative for abdominal pain, constipation, diarrhea, nausea and vomiting.  Genitourinary:  Negative for dysuria.  Musculoskeletal:  Positive for arthralgias. Negative for back pain, joint swelling and neck pain.  Skin:  Negative for rash.  Neurological: Negative.  Negative for tremors and numbness.  Psychiatric/Behavioral:  Negative for behavioral problems (Depression), sleep disturbance and suicidal ideas. The patient is not nervous/anxious.     Vital Signs: BP 123/70   Pulse 75   Temp 98.2 F (36.8 C)   Resp 16   Ht 5\' 9"  (1.753 m)   Wt 207  lb 3.2 oz (94 kg)   SpO2 98%   BMI 30.60 kg/m    Physical Exam Vitals and nursing note reviewed. Exam conducted with a chaperone present.  Constitutional:      General: She is not in acute distress.    Appearance: She is well-developed. She is obese. She is not diaphoretic.  HENT:     Head: Normocephalic and atraumatic.  Eyes:     Pupils: Pupils are equal, round, and reactive to  light.  Neck:     Thyroid : No thyromegaly.     Vascular: No JVD.     Trachea: No tracheal deviation.  Cardiovascular:     Rate and Rhythm: Normal rate and regular rhythm.     Heart sounds: Normal heart sounds. No murmur heard.    No friction rub. No gallop.  Pulmonary:     Effort: Pulmonary effort is normal. No respiratory distress.     Breath sounds: No wheezing or rales.  Chest:     Chest wall: No tenderness.  Genitourinary:    Exam position: Lithotomy position.     Vagina: Vaginal discharge present.     Comments: Pap performed Skin:    General: Skin is warm and dry.  Neurological:     Mental Status: She is alert and oriented to person, place, and time.  Psychiatric:        Behavior: Behavior normal.        Thought Content: Thought content normal.        Judgment: Judgment normal.        Assessment/Plan: 1. Encounter for annual wellness exam in Medicare patient (Primary) AWV performed, labs previously reviewed, up-to-date on mammogram.  Due for bone density and CT lung screening  2. Essential hypertension Stable, continue current medication  3. Primary ovarian failure Will order bone density  4. Former smoker Given previous smoking history we will refer for CT lung cancer screening - CT CHEST LUNG CA SCREEN LOW DOSE W/O CM; Future  5. Routine cervical smear - IGP, Aptima HPV  6. Screening for STDs (sexually transmitted diseases) - NuSwab Vaginitis Plus (VG+)     General Counseling: Neda verbalizes understanding of the findings of todays visit and agrees with plan of  treatment. I have discussed any further diagnostic evaluation that may be needed or ordered today. We also reviewed her medications today. she has been encouraged to call the office with any questions or concerns that should arise related to todays visit.    Orders Placed This Encounter  Procedures   CT CHEST LUNG CA SCREEN LOW DOSE W/O CM   NuSwab Vaginitis Plus (VG+)    No orders of the defined types were placed in this encounter.   Return for after test is done.   Total time spent:35 Minutes Time spent includes review of chart, medications, test results, and follow up plan with the patient.   [place] Controlled Substance Database was reviewed by me.  This patient was seen by Taylor Favia, PA-C in collaboration with Dr. Verneta Gone as a part of collaborative care agreement.  Taylor Favia, PA-C Internal medicine

## 2024-02-22 LAB — NUSWAB VAGINITIS PLUS (VG+)
Atopobium vaginae: HIGH {score} — AB
BVAB 2: HIGH {score} — AB
Candida albicans, NAA: NEGATIVE
Candida glabrata, NAA: POSITIVE — AB
Chlamydia trachomatis, NAA: NEGATIVE
Megasphaera 1: HIGH {score} — AB
Neisseria gonorrhoeae, NAA: NEGATIVE
Trich vag by NAA: NEGATIVE

## 2024-02-29 LAB — IGP, APTIMA HPV: HPV Aptima: NEGATIVE

## 2024-03-01 ENCOUNTER — Telehealth: Payer: Self-pay

## 2024-03-01 ENCOUNTER — Other Ambulatory Visit: Payer: Self-pay

## 2024-03-01 MED ORDER — BORIC ACID VAGINAL 600 MG VA SUPP: 600.0000 mg | Freq: Every day | VAGINAL | 0 refills | Status: DC

## 2024-03-01 MED ORDER — METRONIDAZOLE 500 MG PO TABS: 500.0000 mg | ORAL_TABLET | Freq: Two times a day (BID) | ORAL | 0 refills | Status: AC

## 2024-03-01 NOTE — Telephone Encounter (Addendum)
 LVM for patient regarding pap results, I need to speak with her. Sent Flagyl /Boric Acid suppositories per Barbra Ley.

## 2024-03-01 NOTE — Telephone Encounter (Signed)
-----   Message from Jacques Mattock sent at 03/01/2024 12:01 PM EDT ----- Please let her know that unfortunately her pap did come back as unsatisfactory and will need to be repeated in a few months. She did have BV and a form of yeast that is typically resistant to diflucan. Send flagyl  BID x 7 days and can send boric acid vaginal suppository for 21 days. Advise against alcohol with flagyl .

## 2024-03-02 ENCOUNTER — Telehealth: Payer: Self-pay

## 2024-03-02 NOTE — Telephone Encounter (Signed)
-----   Message from Jacques Mattock sent at 03/01/2024 12:01 PM EDT ----- Please let her know that unfortunately her pap did come back as unsatisfactory and will need to be repeated in a few months. She did have BV and a form of yeast that is typically resistant to diflucan. Send flagyl  BID x 7 days and can send boric acid vaginal suppository for 21 days. Advise against alcohol with flagyl .

## 2024-03-02 NOTE — Telephone Encounter (Signed)
Spoke with patient regarding pap smear results.  

## 2024-03-03 ENCOUNTER — Ambulatory Visit
Admission: RE | Admit: 2024-03-03 | Discharge: 2024-03-03 | Disposition: A | Discharge status: Home / Self Care | Source: Ambulatory Visit | Attending: Physician Assistant

## 2024-03-03 DIAGNOSIS — Z87891 Personal history of nicotine dependence: Secondary | ICD-10-CM

## 2024-03-10 HISTORY — PX: CATARACT EXTRACTION: SUR2

## 2024-03-14 ENCOUNTER — Encounter: Payer: Self-pay | Admitting: Nurse Practitioner

## 2024-03-14 DIAGNOSIS — Z87891 Personal history of nicotine dependence: Secondary | ICD-10-CM | POA: Insufficient documentation

## 2024-03-29 ENCOUNTER — Encounter (INDEPENDENT_AMBULATORY_CARE_PROVIDER_SITE_OTHER): Payer: Self-pay

## 2024-04-14 ENCOUNTER — Other Ambulatory Visit: Payer: Self-pay | Admitting: Physician Assistant

## 2024-04-14 DIAGNOSIS — E782 Mixed hyperlipidemia: Secondary | ICD-10-CM

## 2024-04-14 DIAGNOSIS — I10 Essential (primary) hypertension: Secondary | ICD-10-CM

## 2024-04-21 ENCOUNTER — Telehealth: Payer: Self-pay | Admitting: Physician Assistant

## 2024-04-21 ENCOUNTER — Ambulatory Visit: Payer: Self-pay | Admitting: Physician Assistant

## 2024-04-21 NOTE — Telephone Encounter (Signed)
 Lvm& sent message to schedule f/u-Toni

## 2024-05-02 ENCOUNTER — Encounter: Payer: Self-pay | Admitting: Physician Assistant

## 2024-05-02 ENCOUNTER — Ambulatory Visit (INDEPENDENT_AMBULATORY_CARE_PROVIDER_SITE_OTHER): Admitting: Physician Assistant

## 2024-05-02 VITALS — BP 130/80 | HR 75 | Temp 98.0°F | Resp 16 | Ht 69.0 in | Wt 202.6 lb

## 2024-05-02 DIAGNOSIS — B379 Candidiasis, unspecified: Secondary | ICD-10-CM | POA: Diagnosis not present

## 2024-05-02 DIAGNOSIS — I700 Atherosclerosis of aorta: Secondary | ICD-10-CM | POA: Diagnosis not present

## 2024-05-02 DIAGNOSIS — I2510 Atherosclerotic heart disease of native coronary artery without angina pectoris: Secondary | ICD-10-CM | POA: Diagnosis not present

## 2024-05-02 DIAGNOSIS — I10 Essential (primary) hypertension: Secondary | ICD-10-CM | POA: Diagnosis not present

## 2024-05-02 DIAGNOSIS — J4489 Other specified chronic obstructive pulmonary disease: Secondary | ICD-10-CM

## 2024-05-02 MED ORDER — POTASSIUM CHLORIDE ER 8 MEQ PO TBCR: EXTENDED_RELEASE_TABLET | ORAL | 3 refills | Status: AC

## 2024-05-02 MED ORDER — BORIC ACID VAGINAL 600 MG VA SUPP
600.0000 mg | Freq: Every day | VAGINAL | 0 refills | Status: DC
Start: 2024-05-02 — End: 2024-07-21

## 2024-05-02 NOTE — Progress Notes (Signed)
 [facility] [address] [place], [place] [address]  Internal MEDICINE  Office Visit Note  Patient Name: Breanna Flores  [id number]  [id number]  Date of Service: 05/02/2024  Chief Complaint  Patient presents with   Follow-up    Review CT   Hypertension   Hyperlipidemia   Medication Management    Pharmacy did not fill suppository.    HPI Pt is here for routine follow up -BP stable 130s/60-70s -some stress in the next 2 weeks may contribute to higher reading today initially. Improved on recheck -reviewed CT chest screening from 03/03/24: IMPRESSION: Lung-RADS 2, benign appearance or behavior. Continue annual screening with low-dose chest CT without contrast in 12 months. Aortic Atherosclerosis (ICD10-I70.0) and Emphysema (ICD10-J43.9).  -3 vessel coronary atherosclerosis seen and will refer to cardiology for further evaluation -Pharmacy was unable to boric acid vaginal suppository atypical yeast infection.  Will resend to a different pharmacy.  Patient will also have repeat Pap next visit due to unsatisfactory results. Did complete BV treatment  Current Medication: Outpatient Encounter Medications as of 05/02/2024  Medication Sig   amLODipine  (NORVASC ) 10 MG tablet TAKE 1 TABLET BY MOUTH ONCE DAILY   amLODipine  (NORVASC ) 5 MG tablet TAKE 1 TABLET BY MOUTH ONCE DAILY   aspirin EC 81 MG tablet Take 81 mg by mouth daily. Swallow whole.   ezetimibe  (ZETIA ) 10 MG tablet TAKE 1 TABLET BY MOUTH ONCE DAILY   furosemide  (LASIX ) 20 MG tablet TAKE 1 TABLET BY MOUTH ONCE DAILY AS NEEDED   hydrALAZINE  (APRESOLINE ) 10 MG tablet TAKE 1 TABLET BY MOUTH ONCE DAILY   losartan -hydrochlorothiazide (HYZAAR) 100-25 MG tablet Take 1 tablet by mouth daily.   metoprolol  succinate (TOPROL -XL) 100 MG 24 hr tablet TAKE 1 TABLET BY MOUTH ONCE DAILY   Multiple Vitamin (MULTIVITAMIN) tablet Take 1 tablet by mouth daily.   NON FORMULARY cpap device   simvastatin  (ZOCOR ) 20 MG tablet TAKE 1  TABLET BY MOUTH ONCE DAILY   [DISCONTINUED] Boric Acid Vaginal 600 MG SUPP Place 600 mg vaginally daily.   [DISCONTINUED] potassium chloride  (KLOR-CON ) 8 MEQ tablet TAKE 1 TABLET BY MOUTH 2 DAYS PER WEEK AS DIRECTED   Boric Acid Vaginal 600 MG SUPP Place 600 mg vaginally daily.   potassium chloride  (KLOR-CON ) 8 MEQ tablet TAKE 1 TABLET BY MOUTH 2 DAYS PER WEEK AS DIRECTED   No facility-administered encounter medications on file as of 05/02/2024.    Surgical History: Past Surgical History:  Procedure Laterality Date   COLONOSCOPY WITH PROPOFOL  N/A 01/30/2022   Procedure: COLONOSCOPY WITH PROPOFOL ;  Surgeon: Jinny Carmine, MD;  Location: [facility] ENDOSCOPY;  Service: Endoscopy;  Laterality: N/A;   EYE SURGERY  1963, 1978    Medical History: Past Medical History:  Diagnosis Date   Hyperlipidemia    Hypertension    Sleep apnea     Family History: Family History  Problem Relation Age of Onset   Dementia Mother    Heart murmur Mother    Hypertension Father    Gout Father    Hyperlipidemia Father     Social History   Socioeconomic History   Marital status: Divorced    Spouse name: Not on file   Number of children: Not on file   Years of education: Not on file   Highest education level: Not on file  Occupational History   Not on file  Tobacco Use   Smoking status: Former    Current packs/day: 0.00    Average packs/day: 1 pack/day for  37.9 years (37.9 ttl pk-yrs)    Types: Cigarettes    Start date: 11/29/1976    Quit date: 2016    Years since quitting: 9.5   Smokeless tobacco: Never  Substance and Sexual Activity   Alcohol use: Not Currently    Comment: rarely   Drug use: Never   Sexual activity: Not on file  Other Topics Concern   Not on file  Social History Narrative   Not on file   Social Drivers of Health   Financial Resource Strain: Not on file  Food Insecurity: Not on file  Transportation Needs: Not on file  Physical Activity: Not on file  Stress: Not on  file  Social Connections: Not on file  Intimate Partner Violence: Not on file      Review of Systems  Constitutional: Negative.  Negative for chills, fatigue and unexpected weight change.  HENT: Negative.  Negative for congestion, sneezing and sore throat.   Respiratory: Negative.  Negative for cough, chest tightness, shortness of breath and wheezing.   Cardiovascular: Negative.  Negative for chest pain.  Gastrointestinal:  Negative for abdominal pain, constipation, diarrhea, nausea and vomiting.  Musculoskeletal:  Positive for arthralgias. Negative for back pain, joint swelling and neck pain.  Skin:  Negative for rash.  Neurological: Negative.  Negative for tremors and numbness.  Psychiatric/Behavioral:  Negative for behavioral problems (Depression), sleep disturbance and suicidal ideas. The patient is not nervous/anxious.     Vital Signs: BP 130/80 Comment: 150/73  Pulse 75   Temp 98 F (36.7 C)   Resp 16   Ht 5' 9 (1.753 m)   Wt 202 lb 9.6 oz (91.9 kg)   SpO2 94%   BMI 29.92 kg/m    Physical Exam Vitals reviewed.  Constitutional:      General: She is not in acute distress.    Appearance: Normal appearance. She is obese. She is not ill-appearing.  HENT:     Head: Normocephalic and atraumatic.  Eyes:     Pupils: Pupils are equal, round, and reactive to light.  Cardiovascular:     Rate and Rhythm: Normal rate and regular rhythm.     Heart sounds: No murmur heard.    No friction rub. No gallop.  Pulmonary:     Effort: Pulmonary effort is normal. No respiratory distress.     Breath sounds: Normal breath sounds. No wheezing.  Neurological:     Mental Status: She is alert and oriented to person, place, and time.  Psychiatric:        Mood and Affect: Mood normal.        Behavior: Behavior normal.        Assessment/Plan: 1. Essential hypertension (Primary) Will, continue current medications  2. Aortic atherosclerosis (HCC) Continue simvastatin  and Zetia  and  refer to cardiology - Ambulatory referral to Cardiology  3. 3-vessel coronary artery disease Continue simvastatin  and Zetia  and will refer to cardiology for further evaluation due to finding on CT - Ambulatory referral to Cardiology  4. Candida glabrata infection Will resend boric acid vaginal suppository for treatment of glabrata.  Will plan for repeat Pap due to unsatisfactory results next visit after treatment has been completed - Boric Acid Vaginal 600 MG SUPP; Place 600 mg vaginally daily.  Dispense: 21 suppository; Refill: 0  5. Obstructive chronic bronchitis without exacerbation (HCC) Patient denies any breathing concerns, will continue to monitor. Has PFT and pulmonary visits coming up   General Counseling: Melicia verbalizes understanding of the findings of  todays visit and agrees with plan of treatment. I have discussed any further diagnostic evaluation that may be needed or ordered today. We also reviewed her medications today. she has been encouraged to call the office with any questions or concerns that should arise related to todays visit.    Orders Placed This Encounter  Procedures   Ambulatory referral to Cardiology    Meds ordered this encounter  Medications   Boric Acid Vaginal 600 MG SUPP    Sig: Place 600 mg vaginally daily.    Dispense:  21 suppository    Refill:  0   potassium chloride  (KLOR-CON ) 8 MEQ tablet    Sig: TAKE 1 TABLET BY MOUTH 2 DAYS PER WEEK AS DIRECTED    Dispense:  30 tablet    Refill:  3    This patient was seen by Tinnie Pro, PA-C in collaboration with Dr. Sigrid Bathe as a part of collaborative care agreement.   Total time spent:30 Minutes Time spent includes review of chart, medications, test results, and follow up plan with the patient.      Dr Fozia M Khan Internal medicine

## 2024-05-17 ENCOUNTER — Ambulatory Visit (INDEPENDENT_AMBULATORY_CARE_PROVIDER_SITE_OTHER): Payer: Self-pay | Admitting: Cardiovascular Disease

## 2024-05-17 ENCOUNTER — Encounter: Payer: Self-pay | Admitting: Cardiovascular Disease

## 2024-05-17 VITALS — BP 112/77 | HR 65 | Ht 69.0 in | Wt 201.0 lb

## 2024-05-17 DIAGNOSIS — E782 Mixed hyperlipidemia: Secondary | ICD-10-CM

## 2024-05-17 DIAGNOSIS — G4733 Obstructive sleep apnea (adult) (pediatric): Secondary | ICD-10-CM

## 2024-05-17 DIAGNOSIS — R0602 Shortness of breath: Secondary | ICD-10-CM

## 2024-05-17 DIAGNOSIS — R011 Cardiac murmur, unspecified: Secondary | ICD-10-CM

## 2024-05-17 DIAGNOSIS — I10 Essential (primary) hypertension: Secondary | ICD-10-CM | POA: Diagnosis not present

## 2024-05-17 DIAGNOSIS — F17210 Nicotine dependence, cigarettes, uncomplicated: Secondary | ICD-10-CM

## 2024-05-17 DIAGNOSIS — J4489 Other specified chronic obstructive pulmonary disease: Secondary | ICD-10-CM

## 2024-05-17 NOTE — Progress Notes (Signed)
 Cardiology Office Note   Date:  05/17/2024   ID:  Breanna Flores, DOB June 29, 1961, MRN [id number]  PCP:  Kristina Tinnie POUR, PA-C  Cardiologist:  Denyse Bathe, MD      History of Present Illness: Breanna Flores is a 63 y.o. female who presents for No chief complaint on file.   63 YOWF presents for evaluation as has heart murmer. Has SOB on exertion. No chest pain. Feels dizzy occasionally.  Shortness of Breath This is a chronic problem. The current episode started more than 1 year ago. The problem occurs intermittently. The problem has been waxing and waning. Associated symptoms include leg pain and leg swelling. Pertinent negatives include no chest pain, claudication, orthopnea, PND or syncope. Her past medical history is significant for COPD. There is no history of CAD.      Past Medical History:  Diagnosis Date   Hyperlipidemia    Hypertension    Sleep apnea      Past Surgical History:  Procedure Laterality Date   COLONOSCOPY WITH PROPOFOL  N/A 01/30/2022   Procedure: COLONOSCOPY WITH PROPOFOL ;  Surgeon: Jinny Carmine, MD;  Location: [facility] ENDOSCOPY;  Service: Endoscopy;  Laterality: N/A;   EYE SURGERY  1963, 1978     Current Outpatient Medications  Medication Sig Dispense Refill   amLODipine  (NORVASC ) 10 MG tablet TAKE 1 TABLET BY MOUTH ONCE DAILY 90 tablet 1   amLODipine  (NORVASC ) 5 MG tablet TAKE 1 TABLET BY MOUTH ONCE DAILY 90 tablet 1   aspirin EC 81 MG tablet Take 81 mg by mouth daily. Swallow whole.     Boric Acid Vaginal 600 MG SUPP Place 600 mg vaginally daily. 21 suppository 0   ezetimibe  (ZETIA ) 10 MG tablet TAKE 1 TABLET BY MOUTH ONCE DAILY 90 tablet 0   furosemide  (LASIX ) 20 MG tablet TAKE 1 TABLET BY MOUTH ONCE DAILY AS NEEDED 30 tablet 0   hydrALAZINE  (APRESOLINE ) 10 MG tablet TAKE 1 TABLET BY MOUTH ONCE DAILY 30 tablet 2   losartan -hydrochlorothiazide (HYZAAR) 100-25 MG tablet Take 1 tablet by mouth daily. 90 tablet 0   metoprolol  succinate (TOPROL -XL)  100 MG 24 hr tablet TAKE 1 TABLET BY MOUTH ONCE DAILY 30 tablet 5   Multiple Vitamin (MULTIVITAMIN) tablet Take 1 tablet by mouth daily.     NON FORMULARY cpap device     potassium chloride  (KLOR-CON ) 8 MEQ tablet TAKE 1 TABLET BY MOUTH 2 DAYS PER WEEK AS DIRECTED 30 tablet 3   simvastatin  (ZOCOR ) 20 MG tablet TAKE 1 TABLET BY MOUTH ONCE DAILY 90 tablet 0   No current facility-administered medications for this visit.    Allergies:   Erythromycin, Penicillins, and Tape    Social History:   reports that she quit smoking about 9 years ago. Her smoking use included cigarettes. She started smoking about 47 years ago. She has a 37.9 pack-year smoking history. She has never used smokeless tobacco. She reports that she does not currently use alcohol. She reports that she does not use drugs.   Family History:  family history includes Dementia in her mother; Gout in her father; Heart murmur in her mother; Hyperlipidemia in her father; Hypertension in her father.    ROS:     Review of Systems  Constitutional: Negative.   HENT: Negative.    Eyes: Negative.   Respiratory:  Positive for shortness of breath.   Cardiovascular:  Positive for leg swelling. Negative for chest pain, orthopnea, claudication, syncope and PND.  Gastrointestinal: Negative.  Genitourinary: Negative.   Musculoskeletal: Negative.   Skin: Negative.   Neurological: Negative.   Endo/Heme/Allergies: Negative.   Psychiatric/Behavioral: Negative.    All other systems reviewed and are negative.     All other systems are reviewed and negative.    PHYSICAL EXAM: VS:  BP 112/77   Pulse 65   Ht 5' 9 (1.753 m)   Wt 201 lb (91.2 kg)   SpO2 95%   BMI 29.68 kg/m  , BMI Body mass index is 29.68 kg/m. Last weight:  Wt Readings from Last 3 Encounters:  05/17/24 201 lb (91.2 kg)  05/02/24 202 lb 9.6 oz (91.9 kg)  02/18/24 207 lb 3.2 oz (94 kg)     Physical Exam Constitutional:      Appearance: Normal appearance.   Cardiovascular:     Rate and Rhythm: Normal rate and regular rhythm.     Heart sounds: Normal heart sounds.  Pulmonary:     Effort: Pulmonary effort is normal.     Breath sounds: Normal breath sounds.  Musculoskeletal:     Right lower leg: No edema.     Left lower leg: No edema.  Neurological:     Mental Status: She is alert.       EKG: NSR 60/MIN EARLY REPOLARZATION ABNORMALITIES.  Recent Labs: 08/20/2023: ALT 16; BUN 10; Creatinine, Ser 0.71; Hemoglobin 15.7; Platelets 181; Potassium 3.6; Sodium 136; TSH 1.721    Lipid Panel    Component Value Date/Time   CHOL 107 08/20/2023 0847   TRIG 86 08/20/2023 0847   HDL 52 08/20/2023 0847   CHOLHDL 2.1 08/20/2023 0847   VLDL 17 08/20/2023 0847   LDLCALC 38 08/20/2023 0847      Other studies Reviewed: Additional studies/ records that were reviewed today include:  Review of the above records demonstrates:       No data to display            ASSESSMENT AND PLAN:    ICD-10-CM   1. Heart murmur  R01.1 PCV ECHOCARDIOGRAM COMPLETE    MYOCARDIAL PERFUSION IMAGING   Advise echo    2. Essential hypertension  I10 PCV ECHOCARDIOGRAM COMPLETE    MYOCARDIAL PERFUSION IMAGING    3. OSA (obstructive sleep apnea)  G47.33 PCV ECHOCARDIOGRAM COMPLETE    MYOCARDIAL PERFUSION IMAGING    4. Obstructive chronic bronchitis without exacerbation (HCC)  J44.89 PCV ECHOCARDIOGRAM COMPLETE    MYOCARDIAL PERFUSION IMAGING    5. Mixed hyperlipidemia  E78.2 PCV ECHOCARDIOGRAM COMPLETE    MYOCARDIAL PERFUSION IMAGING    6. Cigarette smoker  F17.210 PCV ECHOCARDIOGRAM COMPLETE    MYOCARDIAL PERFUSION IMAGING    7. SOB (shortness of breath)  R06.02 PCV ECHOCARDIOGRAM COMPLETE    MYOCARDIAL PERFUSION IMAGING   Advise stress test       Problem List Items Addressed This Visit       Cardiovascular and Mediastinum   Essential hypertension   Relevant Orders   PCV ECHOCARDIOGRAM COMPLETE   MYOCARDIAL PERFUSION IMAGING      Respiratory   OSA (obstructive sleep apnea)   Relevant Orders   PCV ECHOCARDIOGRAM COMPLETE   MYOCARDIAL PERFUSION IMAGING   Obstructive chronic bronchitis without exacerbation (HCC)   Relevant Orders   PCV ECHOCARDIOGRAM COMPLETE   MYOCARDIAL PERFUSION IMAGING     Other   Hyperlipidemia   Relevant Orders   PCV ECHOCARDIOGRAM COMPLETE   MYOCARDIAL PERFUSION IMAGING   Cigarette smoker   Relevant Orders   PCV ECHOCARDIOGRAM COMPLETE   MYOCARDIAL  PERFUSION IMAGING   Other Visit Diagnoses       Heart murmur    -  Primary   Advise echo   Relevant Orders   PCV ECHOCARDIOGRAM COMPLETE   MYOCARDIAL PERFUSION IMAGING     SOB (shortness of breath)       Advise stress test   Relevant Orders   PCV ECHOCARDIOGRAM COMPLETE   MYOCARDIAL PERFUSION IMAGING          Disposition:   Return in about 2 weeks (around 05/31/2024) for echo, stress test and f/u.    Total time spent: 50 minutes  Signed,  Denyse Bathe, MD  05/17/2024 10:52 AM    [facility]

## 2024-05-25 ENCOUNTER — Telehealth: Payer: Self-pay | Admitting: Internal Medicine

## 2024-05-25 NOTE — Telephone Encounter (Signed)
 Left vm and sent mychart message to confirm 06/01/24 appointment-Toni

## 2024-05-31 ENCOUNTER — Other Ambulatory Visit: Payer: Self-pay | Admitting: Physician Assistant

## 2024-05-31 DIAGNOSIS — I10 Essential (primary) hypertension: Secondary | ICD-10-CM

## 2024-06-01 ENCOUNTER — Ambulatory Visit (INDEPENDENT_AMBULATORY_CARE_PROVIDER_SITE_OTHER): Admitting: Internal Medicine

## 2024-06-01 DIAGNOSIS — J4489 Other specified chronic obstructive pulmonary disease: Secondary | ICD-10-CM

## 2024-06-16 ENCOUNTER — Ambulatory Visit

## 2024-06-16 DIAGNOSIS — F17210 Nicotine dependence, cigarettes, uncomplicated: Secondary | ICD-10-CM

## 2024-06-16 DIAGNOSIS — R0602 Shortness of breath: Secondary | ICD-10-CM

## 2024-06-16 DIAGNOSIS — J4489 Other specified chronic obstructive pulmonary disease: Secondary | ICD-10-CM

## 2024-06-16 DIAGNOSIS — G4733 Obstructive sleep apnea (adult) (pediatric): Secondary | ICD-10-CM

## 2024-06-16 DIAGNOSIS — E782 Mixed hyperlipidemia: Secondary | ICD-10-CM

## 2024-06-16 DIAGNOSIS — R011 Cardiac murmur, unspecified: Secondary | ICD-10-CM

## 2024-06-16 DIAGNOSIS — I10 Essential (primary) hypertension: Secondary | ICD-10-CM

## 2024-06-17 ENCOUNTER — Ambulatory Visit

## 2024-06-17 DIAGNOSIS — I10 Essential (primary) hypertension: Secondary | ICD-10-CM

## 2024-06-17 DIAGNOSIS — J4489 Other specified chronic obstructive pulmonary disease: Secondary | ICD-10-CM

## 2024-06-17 DIAGNOSIS — R0602 Shortness of breath: Secondary | ICD-10-CM

## 2024-06-17 DIAGNOSIS — G4733 Obstructive sleep apnea (adult) (pediatric): Secondary | ICD-10-CM

## 2024-06-17 DIAGNOSIS — I361 Nonrheumatic tricuspid (valve) insufficiency: Secondary | ICD-10-CM

## 2024-06-17 DIAGNOSIS — I371 Nonrheumatic pulmonary valve insufficiency: Secondary | ICD-10-CM

## 2024-06-17 DIAGNOSIS — E782 Mixed hyperlipidemia: Secondary | ICD-10-CM

## 2024-06-17 DIAGNOSIS — I340 Nonrheumatic mitral (valve) insufficiency: Secondary | ICD-10-CM

## 2024-06-17 DIAGNOSIS — F17210 Nicotine dependence, cigarettes, uncomplicated: Secondary | ICD-10-CM

## 2024-06-17 DIAGNOSIS — R011 Cardiac murmur, unspecified: Secondary | ICD-10-CM

## 2024-06-18 ENCOUNTER — Other Ambulatory Visit: Payer: Self-pay | Admitting: Physician Assistant

## 2024-06-18 DIAGNOSIS — I10 Essential (primary) hypertension: Secondary | ICD-10-CM

## 2024-06-21 ENCOUNTER — Encounter: Payer: Self-pay | Admitting: Internal Medicine

## 2024-06-21 ENCOUNTER — Ambulatory Visit: Admitting: Internal Medicine

## 2024-06-21 VITALS — BP 150/79 | HR 69 | Temp 98.9°F | Resp 16 | Ht 69.0 in | Wt 199.0 lb

## 2024-06-21 DIAGNOSIS — G4733 Obstructive sleep apnea (adult) (pediatric): Secondary | ICD-10-CM

## 2024-06-21 DIAGNOSIS — J4489 Other specified chronic obstructive pulmonary disease: Secondary | ICD-10-CM | POA: Diagnosis not present

## 2024-06-21 DIAGNOSIS — Z87891 Personal history of nicotine dependence: Secondary | ICD-10-CM | POA: Diagnosis not present

## 2024-06-21 MED ORDER — VARENICLINE TARTRATE 1 MG PO TABS: 1.0000 mg | ORAL_TABLET | Freq: Two times a day (BID) | ORAL | 2 refills | Status: AC

## 2024-06-21 MED ORDER — VARENICLINE TARTRATE 0.5 MG PO TABS: 0.5000 mg | ORAL_TABLET | Freq: Two times a day (BID) | ORAL | 0 refills | Status: AC

## 2024-06-21 NOTE — Patient Instructions (Signed)

## 2024-06-21 NOTE — Progress Notes (Signed)
 [facility] [address] [place], [place] [address]  Pulmonary Sleep Medicine   Office Visit Note  Patient Name: Breanna Flores DOB: 07/30/1961 MRN [id number]  Date of Service: 06/21/2024  Complaints/HPI: She is doing well. Has noted she is baseline no shortness of breath today.Her FEV1 was 52% this appears to be down from the last PFT. Unfortunately she has restarted smoking. About half pack daily. 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 Eyes: (-) visual changes, (-) redness, (-) itching. Nose and Sinuses: (-) nasal stuffiness or itchiness, (-) postnasal drip, (-) nosebleeds, (-) sinus trouble. Mouth and Throat: (-) sore throat, (-) hoarseness. Neck: (-) swollen glands, (-) enlarged thyroid , (-) neck pain. Respiratory: + cough, (-) bloody sputum, - shortness of breath, - wheezing. Cardiovascular: - ankle swelling, (-) chest pain. Lymphatic: (-) lymph node enlargement. Neurologic: (-) numbness, (-) tingling. Psychiatric: (-) anxiety, (-) depression   Current Medication: Outpatient Encounter Medications as of 06/21/2024  Medication Sig   amLODipine  (NORVASC ) 10 MG tablet TAKE 1 TABLET BY MOUTH ONCE DAILY   amLODipine  (NORVASC ) 5 MG tablet TAKE 1 TABLET BY MOUTH ONCE DAILY   aspirin EC 81 MG tablet Take 81 mg by mouth daily. Swallow whole.   Boric Acid Vaginal 600 MG SUPP Place 600 mg vaginally daily.   ezetimibe  (ZETIA ) 10 MG tablet TAKE 1 TABLET BY MOUTH ONCE DAILY   furosemide  (LASIX ) 20 MG tablet TAKE 1 TABLET BY MOUTH ONCE DAILY AS NEEDED   hydrALAZINE  (APRESOLINE ) 10 MG tablet TAKE 1 TABLET BY MOUTH ONCE DAILY   losartan -hydrochlorothiazide (HYZAAR) 100-25 MG tablet Take 1 tablet by mouth daily.   metoprolol  succinate (TOPROL -XL) 100 MG 24 hr tablet TAKE 1 TABLET BY MOUTH ONCE DAILY   Multiple Vitamin  (MULTIVITAMIN) tablet Take 1 tablet by mouth daily.   NON FORMULARY cpap device   potassium chloride  (KLOR-CON ) 8 MEQ tablet TAKE 1 TABLET BY MOUTH 2 DAYS PER WEEK AS DIRECTED   simvastatin  (ZOCOR ) 20 MG tablet TAKE 1 TABLET BY MOUTH ONCE DAILY   No facility-administered encounter medications on file as of 06/21/2024.    Surgical History: Past Surgical History:  Procedure Laterality Date   COLONOSCOPY WITH PROPOFOL  N/A 01/30/2022   Procedure: COLONOSCOPY WITH PROPOFOL ;  Surgeon: Jinny Carmine, MD;  Location: [facility] ENDOSCOPY;  Service: Endoscopy;  Laterality: N/A;   EYE SURGERY  1963, 1978    Medical History: Past Medical History:  Diagnosis Date   Hyperlipidemia    Hypertension    Sleep apnea     Family History: Family History  Problem Relation Age of Onset   Dementia Mother    Heart murmur Mother    Hypertension Father    Gout Father    Hyperlipidemia Father     Social History: Social History   Socioeconomic History   Marital status: Divorced    Spouse name: Not on file   Number of children: Not on file   Years of education: Not on file   Highest education level: Not on file  Occupational History   Not on file  Tobacco Use   Smoking status: Every Day    Current packs/day: 0.00    Average packs/day: 1 pack/day for 37.9 years (37.9 ttl pk-yrs)    Types: Cigarettes    Start date: 11/29/1976    Last attempt to  quit: 2016    Years since quitting: 9.6   Smokeless tobacco: Never   Tobacco comments:    A couple of cigarettes a day, has restarted smoking.  Substance and Sexual Activity   Alcohol use: Not Currently    Comment: rarely   Drug use: Never   Sexual activity: Not on file  Other Topics Concern   Not on file  Social History Narrative   Not on file   Social Drivers of Health   Financial Resource Strain: Not on file  Food Insecurity: Not on file  Transportation Needs: Not on file  Physical Activity: Not on file  Stress: Not on file  Social  Connections: Not on file  Intimate Partner Violence: Not on file    Vital Signs: Blood pressure (!) 150/79, pulse 69, temperature 98.9 F (37.2 C), resp. rate 16, height 5' 9 (1.753 m), weight 199 lb (90.3 kg), SpO2 98%.  Examination: General Appearance: The patient is well-developed, well-nourished, and in no distress. Skin: Gross inspection of skin unremarkable. Head: normocephalic, no gross deformities. Eyes: no gross deformities noted. ENT: ears appear grossly normal no exudates. Neck: Supple. No thyromegaly. No LAD. Respiratory: no rhonchi noted. Cardiovascular: Normal S1 and S2 without murmur or rub. Extremities: No cyanosis. pulses are equal. Neurologic: Alert and oriented. No involuntary movements.  LABS: No results found for this or any previous visit (from the past 2160 hours).  Radiology: CT CHEST LUNG CA SCREEN LOW DOSE W/O CM Result Date: 03/27/2024 CLINICAL DATA:  63 year old female former smoker, with 38 pack-year history of smoking, for initial lung cancer screening EXAM: CT CHEST WITHOUT CONTRAST LOW-DOSE FOR LUNG CANCER SCREENING TECHNIQUE: Multidetector CT imaging of the chest was performed following the standard protocol without IV contrast. RADIATION DOSE REDUCTION: This exam was performed according to the departmental dose-optimization program which includes automated exposure control, adjustment of the mA and/or kV according to patient size and/or use of iterative reconstruction technique. COMPARISON:  None Available. FINDINGS: Cardiovascular: The heart is normal in size. No pericardial effusion. No evidence of thoracic aortic aneurysm. Atherosclerotic calcifications of the aortic root/arch. Moderate three-vessel coronary atherosclerosis. Mediastinum/Nodes: No suspicious mediastinal lymphadenopathy. Visualized thyroid  is unremarkable. Lungs/Pleura: Mild centrilobular and paraseptal emphysematous changes, upper lung predominant. No focal consolidation. Small bilateral  pulmonary nodules measuring up to 3.8 mm, benign. No pleural effusion or pneumothorax. Upper Abdomen: Vascular calcifications. Musculoskeletal: Mild degenerative changes of the visualized thoracolumbar spine. IMPRESSION: Lung-RADS 2, benign appearance or behavior. Continue annual screening with low-dose chest CT without contrast in 12 months. Aortic Atherosclerosis (ICD10-I70.0) and Emphysema (ICD10-J43.9). Electronically Signed   By: Pinkie Pebbles M.D.   On: 03/27/2024 02:05    No results found.  No results found.  Assessment and Plan: Patient Active Problem List   Diagnosis Date Noted   Stopped smoking more than 1 year ago 03/14/2024   Atherosclerosis of native arteries of extremity with intermittent claudication (HCC) 03/12/2023   Colon cancer screening    Polyp of descending colon    Gastroenteritis 07/01/2019   Obstructive chronic bronchitis without exacerbation (HCC) 07/01/2019   Screening for breast cancer 12/13/2018   Cigarette smoker 12/13/2018   Dysuria 12/13/2018   Exposure to sexually transmitted disease (STD) 12/13/2018   OSA (obstructive sleep apnea) 03/18/2018   Essential hypertension 03/18/2018   Hyperlipidemia 03/18/2018    1. Obstructive chronic bronchitis without exacerbation (HCC) (Primary) She has done so well not smoking but because of the circumstances and increased anxiety she states she has gone back  to cigarette smoking.  She is however open to trying Chantix  and so therefore gave her a prescription for the Chantix   2. Obesity, morbid (HCC) She has also gained some weight work on diet exercise weight loss.  3. OSA (obstructive sleep apnea) On positive airway pressure the plan is going to be to continue with current settings  4. Smoker within last 12 months Needs to stop smoking Lung scan screen done in May was benign appearing - varenicline  (CHANTIX ) 0.5 MG tablet; Take 1 tablet (0.5 mg total) by mouth 2 (two) times daily.  Dispense: 28 tablet;  Refill: 0 - varenicline  (CHANTIX  CONTINUING MONTH PAK) 1 MG tablet; Take 1 tablet (1 mg total) by mouth 2 (two) times daily.  Dispense: 60 tablet; Refill: 2   General Counseling: I have discussed the findings of the evaluation and examination with Alfonso.  I have also discussed any further diagnostic evaluation thatmay be needed or ordered today. Lera verbalizes understanding of the findings of todays visit. We also reviewed her medications today and discussed drug interactions and side effects including but not limited excessive drowsiness and altered mental states. We also discussed that there is always a risk not just to her but also people around her. she has been encouraged to call the office with any questions or concerns that should arise related to todays visit.  No orders of the defined types were placed in this encounter.    Time spent: 63  I have personally obtained a history, examined the patient, evaluated laboratory and imaging results, formulated the assessment and plan and placed orders.    Elfreda DELENA Bathe, MD [facility] Pulmonary and Critical Care Sleep medicine

## 2024-06-22 ENCOUNTER — Ambulatory Visit: Admitting: Nurse Practitioner

## 2024-06-22 NOTE — Procedures (Signed)
 [facility] [address] [place], [address]    Complete Pulmonary Function Testing Interpretation:  FINDINGS:  The forced vital capacity is mildly decreased.  The FEV1 has 1.44 L which is 52% of predicted and is moderately decreased.  The FEV1 FVC ratio is moderately decreased.  Postbronchodilator there was significant improvement in the FEV1.  The total lung capacity is normal.  Residual volume is increased.  FRC is a normal.  The DLCO was mildly decreased.  IMPRESSION:  This pulmonary function study is consistent with moderate obstructive lung disease with a positive bronchodilator response.  Clinical correlation is recommended  Breanna DELENA Bathe, MD [facility] Pulmonary Critical Care Medicine Sleep Medicine

## 2024-06-23 ENCOUNTER — Ambulatory Visit (INDEPENDENT_AMBULATORY_CARE_PROVIDER_SITE_OTHER): Admitting: Cardiovascular Disease

## 2024-06-23 ENCOUNTER — Encounter: Payer: Self-pay | Admitting: Cardiovascular Disease

## 2024-06-23 VITALS — BP 122/70 | HR 71 | Ht 69.0 in | Wt 199.8 lb

## 2024-06-23 DIAGNOSIS — I70213 Atherosclerosis of native arteries of extremities with intermittent claudication, bilateral legs: Secondary | ICD-10-CM

## 2024-06-23 DIAGNOSIS — I10 Essential (primary) hypertension: Secondary | ICD-10-CM

## 2024-06-23 DIAGNOSIS — F17209 Nicotine dependence, unspecified, with unspecified nicotine-induced disorders: Secondary | ICD-10-CM | POA: Diagnosis not present

## 2024-06-23 DIAGNOSIS — E782 Mixed hyperlipidemia: Secondary | ICD-10-CM

## 2024-06-23 DIAGNOSIS — R0602 Shortness of breath: Secondary | ICD-10-CM

## 2024-06-23 NOTE — Progress Notes (Signed)
 Cardiology Office Note   Date:  06/23/2024   ID:  Breanna Flores, DOB 23-Feb-1961, MRN [id number]  PCP:  Breanna Tinnie POUR, PA-C  Cardiologist:  Denyse Bathe, MD      History of Present Illness: Breanna Flores is a 63 y.o. female who presents for  Chief Complaint  Patient presents with   Follow-up    NST/Echo results    No chest pain or SOB      Past Medical History:  Diagnosis Date   Hyperlipidemia    Hypertension    Sleep apnea      Past Surgical History:  Procedure Laterality Date   COLONOSCOPY WITH PROPOFOL  N/A 01/30/2022   Procedure: COLONOSCOPY WITH PROPOFOL ;  Surgeon: Breanna Carmine, MD;  Location: [facility] ENDOSCOPY;  Service: Endoscopy;  Laterality: N/A;   EYE SURGERY  1963, 1978     Current Outpatient Medications  Medication Sig Dispense Refill   amLODipine  (NORVASC ) 10 MG tablet TAKE 1 TABLET BY MOUTH ONCE DAILY 90 tablet 1   amLODipine  (NORVASC ) 5 MG tablet TAKE 1 TABLET BY MOUTH ONCE DAILY 90 tablet 1   aspirin EC 81 MG tablet Take 81 mg by mouth daily. Swallow whole.     Boric Acid Vaginal 600 MG SUPP Place 600 mg vaginally daily. 21 suppository 0   ezetimibe  (ZETIA ) 10 MG tablet TAKE 1 TABLET BY MOUTH ONCE DAILY 90 tablet 0   furosemide  (LASIX ) 20 MG tablet TAKE 1 TABLET BY MOUTH ONCE DAILY AS NEEDED 30 tablet 3   hydrALAZINE  (APRESOLINE ) 10 MG tablet TAKE 1 TABLET BY MOUTH ONCE DAILY 30 tablet 2   losartan -hydrochlorothiazide (HYZAAR) 100-25 MG tablet Take 1 tablet by mouth daily. 90 tablet 0   metoprolol  succinate (TOPROL -XL) 100 MG 24 hr tablet TAKE 1 TABLET BY MOUTH ONCE DAILY 30 tablet 5   Multiple Vitamin (MULTIVITAMIN) tablet Take 1 tablet by mouth daily.     NON FORMULARY cpap device     potassium chloride  (KLOR-CON ) 8 MEQ tablet TAKE 1 TABLET BY MOUTH 2 DAYS PER WEEK AS DIRECTED 30 tablet 3   simvastatin  (ZOCOR ) 20 MG tablet TAKE 1 TABLET BY MOUTH ONCE DAILY 90 tablet 0   varenicline  (CHANTIX  CONTINUING MONTH PAK) 1 MG tablet Take 1 tablet  (1 mg total) by mouth 2 (two) times daily. 60 tablet 2   varenicline  (CHANTIX ) 0.5 MG tablet Take 1 tablet (0.5 mg total) by mouth 2 (two) times daily. 28 tablet 0   No current facility-administered medications for this visit.    Allergies:   Erythromycin, Penicillins, and Tape    Social History:   reports that she has been smoking cigarettes. She started smoking about 47 years ago. She has a 37.9 pack-year smoking history. She has never used smokeless tobacco. She reports that she does not currently use alcohol. She reports that she does not use drugs.   Family History:  family history includes Dementia in her mother; Gout in her father; Heart murmur in her mother; Hyperlipidemia in her father; Hypertension in her father.    ROS:     Review of Systems  Constitutional: Negative.   HENT: Negative.    Eyes: Negative.   Respiratory: Negative.    Gastrointestinal: Negative.   Genitourinary: Negative.   Musculoskeletal: Negative.   Skin: Negative.   Neurological: Negative.   Endo/Heme/Allergies: Negative.   Psychiatric/Behavioral: Negative.    All other systems reviewed and are negative.     All other systems are reviewed and negative.  PHYSICAL EXAM: VS:  BP 122/70   Pulse 71   Ht 5' 9 (1.753 m)   Wt 199 lb 12.8 oz (90.6 kg)   SpO2 96%   BMI 29.51 kg/m  , BMI Body mass index is 29.51 kg/m. Last weight:  Wt Readings from Last 3 Encounters:  06/23/24 199 lb 12.8 oz (90.6 kg)  06/21/24 199 lb (90.3 kg)  05/17/24 201 lb (91.2 kg)     Physical Exam Constitutional:      Appearance: Normal appearance.  Cardiovascular:     Rate and Rhythm: Normal rate and regular rhythm.     Heart sounds: Normal heart sounds.  Pulmonary:     Effort: Pulmonary effort is normal.     Breath sounds: Normal breath sounds.  Musculoskeletal:     Right lower leg: No edema.     Left lower leg: No edema.  Neurological:     Mental Status: She is alert.       EKG:   Recent  Labs: 08/20/2023: ALT 16; BUN 10; Creatinine, Ser 0.71; Hemoglobin 15.7; Platelets 181; Potassium 3.6; Sodium 136; TSH 1.721    Lipid Panel    Component Value Date/Time   CHOL 107 08/20/2023 0847   TRIG 86 08/20/2023 0847   HDL 52 08/20/2023 0847   CHOLHDL 2.1 08/20/2023 0847   VLDL 17 08/20/2023 0847   LDLCALC 38 08/20/2023 0847      Other studies Reviewed: Additional studies/ records that were reviewed today include:  Review of the above records demonstrates:       No data to display            ASSESSMENT AND PLAN:    ICD-10-CM   1. SOB (shortness of breath)  R06.02    feels better now as had bronchitis. on chantix  for cessation of smoking. stress test had no ischaemia, LVEF 77%. ECHO  trace MR, grade 1 diastolic dysfunction.    2. Atherosclerosis of native artery of both lower extremities with intermittent claudication (HCC)  I70.213     3. Essential hypertension  I10     4. Mixed hyperlipidemia  E78.2        Problem List Items Addressed This Visit       Cardiovascular and Mediastinum   Essential hypertension   Atherosclerosis of native arteries of extremity with intermittent claudication (HCC)     Other   Hyperlipidemia   Other Visit Diagnoses       SOB (shortness of breath)    -  Primary   feels better now as had bronchitis. on chantix  for cessation of smoking. stress test had no ischaemia, LVEF 77%. ECHO  trace MR, grade 1 diastolic dysfunction.          Disposition:   Return in about 3 months (around 09/23/2024).    Total time spent: 30 minutes  Signed,  Denyse Bathe, MD  06/23/2024 10:01 AM    [facility]

## 2024-06-30 LAB — PULMONARY FUNCTION TEST

## 2024-07-04 ENCOUNTER — Ambulatory Visit: Admitting: Physician Assistant

## 2024-07-12 ENCOUNTER — Other Ambulatory Visit: Payer: Self-pay | Admitting: Physician Assistant

## 2024-07-12 DIAGNOSIS — I10 Essential (primary) hypertension: Secondary | ICD-10-CM

## 2024-07-12 DIAGNOSIS — E782 Mixed hyperlipidemia: Secondary | ICD-10-CM

## 2024-07-19 LAB — PULMONARY FUNCTION TEST

## 2024-07-21 ENCOUNTER — Encounter: Payer: Self-pay | Admitting: Physician Assistant

## 2024-07-21 ENCOUNTER — Ambulatory Visit (INDEPENDENT_AMBULATORY_CARE_PROVIDER_SITE_OTHER): Admitting: Physician Assistant

## 2024-07-21 VITALS — BP 125/66 | HR 61 | Temp 98.0°F | Resp 16 | Ht 69.0 in | Wt 196.0 lb

## 2024-07-21 DIAGNOSIS — E782 Mixed hyperlipidemia: Secondary | ICD-10-CM

## 2024-07-21 DIAGNOSIS — R7303 Prediabetes: Secondary | ICD-10-CM

## 2024-07-21 DIAGNOSIS — I10 Essential (primary) hypertension: Secondary | ICD-10-CM

## 2024-07-21 DIAGNOSIS — R5383 Other fatigue: Secondary | ICD-10-CM

## 2024-07-21 DIAGNOSIS — Z124 Encounter for screening for malignant neoplasm of cervix: Secondary | ICD-10-CM | POA: Diagnosis not present

## 2024-07-21 DIAGNOSIS — E538 Deficiency of other specified B group vitamins: Secondary | ICD-10-CM

## 2024-07-21 DIAGNOSIS — Z113 Encounter for screening for infections with a predominantly sexual mode of transmission: Secondary | ICD-10-CM

## 2024-07-21 DIAGNOSIS — E559 Vitamin D deficiency, unspecified: Secondary | ICD-10-CM

## 2024-07-21 DIAGNOSIS — R946 Abnormal results of thyroid function studies: Secondary | ICD-10-CM

## 2024-07-21 MED ORDER — COVID-19 SUBUNIT VACC-NOVAVAX 5 MCG/0.5ML IM SUSP: 5.0000 ug | Freq: Once | INTRAMUSCULAR | 0 refills | Status: AC

## 2024-07-21 NOTE — Progress Notes (Signed)
 [facility] [address] [place], [place] [address]  Internal MEDICINE  Office Visit Note  Patient Name: Breanna Flores  [id number]  [id number]  Date of Service: 07/21/2024  Chief Complaint  Patient presents with   Follow-up    Repeat pap    HPI Pt is here for routine follow up for repeat pap -Has had pulmonology and cardiology visits since last visit -had stress test and states this went well, no med changes from cardiology -unfortunately started smoking again, so she is on chantix  now to help -due for repeat pap today -will also go ahead and order annual labs, wondering if potassium still needed  Current Medication: Outpatient Encounter Medications as of 07/21/2024  Medication Sig   amLODipine  (NORVASC ) 10 MG tablet TAKE 1 TABLET BY MOUTH ONCE DAILY   amLODipine  (NORVASC ) 5 MG tablet TAKE 1 TABLET BY MOUTH ONCE DAILY   aspirin EC 81 MG tablet Take 81 mg by mouth daily. Swallow whole.   COVID-19 adjuvanted vaccine, NOVAVAX, 5 MCG/0.5ML injection Inject 0.5 mLs into the muscle once for 1 dose.   ezetimibe  (ZETIA ) 10 MG tablet TAKE 1 TABLET BY MOUTH ONCE DAILY   furosemide  (LASIX ) 20 MG tablet TAKE 1 TABLET BY MOUTH ONCE DAILY AS NEEDED   hydrALAZINE  (APRESOLINE ) 10 MG tablet TAKE 1 TABLET BY MOUTH ONCE DAILY   losartan -hydrochlorothiazide (HYZAAR) 100-25 MG tablet TAKE 1 TABLET BY MOUTH ONCE DAILY   metoprolol  succinate (TOPROL -XL) 100 MG 24 hr tablet TAKE 1 TABLET BY MOUTH ONCE DAILY   Multiple Vitamin (MULTIVITAMIN) tablet Take 1 tablet by mouth daily.   NON FORMULARY cpap device   potassium chloride  (KLOR-CON ) 8 MEQ tablet TAKE 1 TABLET BY MOUTH 2 DAYS PER WEEK AS DIRECTED   simvastatin  (ZOCOR ) 20 MG tablet TAKE 1 TABLET BY MOUTH ONCE DAILY *NEED APPOINTMENT FOR NEXT REFILLS   varenicline  (CHANTIX  CONTINUING MONTH PAK) 1 MG tablet Take 1 tablet (1 mg total) by mouth 2 (two) times daily.   varenicline  (CHANTIX ) 0.5 MG tablet Take 1 tablet (0.5 mg total) by mouth  2 (two) times daily.   [DISCONTINUED] Boric Acid Vaginal 600 MG SUPP Place 600 mg vaginally daily.   No facility-administered encounter medications on file as of 07/21/2024.    Surgical History: Past Surgical History:  Procedure Laterality Date   COLONOSCOPY WITH PROPOFOL  N/A 01/30/2022   Procedure: COLONOSCOPY WITH PROPOFOL ;  Surgeon: Jinny Carmine, MD;  Location: [facility] ENDOSCOPY;  Service: Endoscopy;  Laterality: N/A;   EYE SURGERY  1963, 1978    Medical History: Past Medical History:  Diagnosis Date   Hyperlipidemia    Hypertension    Sleep apnea     Family History: Family History  Problem Relation Age of Onset   Dementia Mother    Heart murmur Mother    Hypertension Father    Gout Father    Hyperlipidemia Father     Social History   Socioeconomic History   Marital status: Divorced    Spouse name: Not on file   Number of children: Not on file   Years of education: Not on file   Highest education level: Not on file  Occupational History   Not on file  Tobacco Use   Smoking status: Every Day    Current packs/day: 0.00    Average packs/day: 1 pack/day for 37.9 years (37.9 ttl pk-yrs)    Types: Cigarettes    Start date: 11/29/1976    Last attempt to quit: 2016    Years since  quitting: 9.7   Smokeless tobacco: Never   Tobacco comments:    A couple of cigarettes a day, has restarted smoking.try to stopped its on Chantix   Substance and Sexual Activity   Alcohol use: Not Currently    Comment: rarely   Drug use: Never   Sexual activity: Not on file  Other Topics Concern   Not on file  Social History Narrative   Not on file   Social Drivers of Health   Financial Resource Strain: Not on file  Food Insecurity: Not on file  Transportation Needs: Not on file  Physical Activity: Not on file  Stress: Not on file  Social Connections: Not on file  Intimate Partner Violence: Not on file      Review of Systems  Constitutional: Negative.  Negative for chills,  fatigue and unexpected weight change.  HENT: Negative.  Negative for congestion, sneezing and sore throat.   Respiratory: Negative.  Negative for cough, chest tightness, shortness of breath and wheezing.   Cardiovascular: Negative.  Negative for chest pain.  Gastrointestinal:  Negative for abdominal pain, constipation, diarrhea, nausea and vomiting.  Musculoskeletal:  Positive for arthralgias. Negative for back pain, joint swelling and neck pain.  Skin:  Negative for rash.  Neurological: Negative.  Negative for tremors and numbness.  Psychiatric/Behavioral:  Negative for behavioral problems (Depression), sleep disturbance and suicidal ideas. The patient is not nervous/anxious.     Vital Signs: BP 125/66   Pulse 61   Temp 98 F (36.7 C)   Resp 16   Ht 5' 9 (1.753 m)   Wt 196 lb (88.9 kg)   SpO2 98%   BMI 28.94 kg/m    Physical Exam Vitals reviewed. Exam conducted with a chaperone present.  Constitutional:      General: She is not in acute distress.    Appearance: Normal appearance. She is obese. She is not ill-appearing.  HENT:     Head: Normocephalic and atraumatic.  Eyes:     Pupils: Pupils are equal, round, and reactive to light.  Cardiovascular:     Rate and Rhythm: Normal rate and regular rhythm.     Heart sounds: No murmur heard.    No friction rub. No gallop.  Pulmonary:     Effort: Pulmonary effort is normal. No respiratory distress.     Breath sounds: Normal breath sounds. No wheezing.  Genitourinary:    Exam position: Lithotomy position.     Vagina: Vaginal discharge present.     Cervix: Normal.     Comments: Pap performed Neurological:     Mental Status: She is alert and oriented to person, place, and time.  Psychiatric:        Mood and Affect: Mood normal.        Behavior: Behavior normal.        Assessment/Plan: 1. Essential hypertension (Primary) Stable, continue current medications  2. Routine cervical smear - IGP, Aptima HPV  3. Screen  for sexually transmitted diseases - NuSwab Vaginitis Plus (VG+)  4. Mixed hyperlipidemia Continue current medications and will update labs - Lipid Panel With LDL/HDL Ratio  5. Vitamin D  deficiency - VITAMIN D  25 Hydroxy (Vit-D Deficiency, Fractures)  6. Prediabetes - Hgb A1C w/o eAG  7. B12 deficiency - B12 and Folate Panel  8. Abnormal thyroid  exam - TSH + free T4  9. Other fatigue - CBC w/Diff/Platelet - Comprehensive metabolic panel with GFR - TSH + free T4 - Lipid Panel With LDL/HDL Ratio - Hgb A1C  w/o eAG - VITAMIN D  25 Hydroxy (Vit-D Deficiency, Fractures) - B12 and Folate Panel   General Counseling: [place] verbalizes understanding of the findings of todays visit and agrees with plan of treatment. I have discussed any further diagnostic evaluation that may be needed or ordered today. We also reviewed her medications today. she has been encouraged to call the office with any questions or concerns that should arise related to todays visit.    Orders Placed This Encounter  Procedures   NuSwab Vaginitis Plus (VG+)   CBC w/Diff/Platelet   Comprehensive metabolic panel with GFR   TSH + free T4   Lipid Panel With LDL/HDL Ratio   Hgb A1C w/o eAG   VITAMIN D  25 Hydroxy (Vit-D Deficiency, Fractures)   B12 and Folate Panel    Meds ordered this encounter  Medications   COVID-19 adjuvanted vaccine, NOVAVAX, 5 MCG/0.5ML injection    Sig: Inject 0.5 mLs into the muscle once for 1 dose.    Dispense:  0.5 mL    Refill:  0    This patient was seen by Tinnie Pro, PA-C in collaboration with Dr. Sigrid Bathe as a part of collaborative care agreement.   Total time spent:30 Minutes Time spent includes review of chart, medications, test results, and follow up plan with the patient.      Dr Fozia M Khan Internal medicine

## 2024-07-23 ENCOUNTER — Other Ambulatory Visit: Payer: Self-pay | Admitting: Physician Assistant

## 2024-07-23 DIAGNOSIS — I10 Essential (primary) hypertension: Secondary | ICD-10-CM

## 2024-07-23 DIAGNOSIS — E782 Mixed hyperlipidemia: Secondary | ICD-10-CM

## 2024-07-24 LAB — IGP, APTIMA HPV: HPV Aptima: NEGATIVE

## 2024-07-24 LAB — NUSWAB VAGINITIS PLUS (VG+): Candida glabrata, NAA: POSITIVE — AB

## 2024-08-06 ENCOUNTER — Other Ambulatory Visit: Payer: Self-pay | Admitting: Physician Assistant

## 2024-08-06 DIAGNOSIS — I10 Essential (primary) hypertension: Secondary | ICD-10-CM

## 2024-08-22 ENCOUNTER — Other Ambulatory Visit: Payer: Self-pay | Admitting: Physician Assistant

## 2024-08-22 DIAGNOSIS — I10 Essential (primary) hypertension: Secondary | ICD-10-CM

## 2024-08-24 ENCOUNTER — Ambulatory Visit: Payer: Self-pay | Admitting: Physician Assistant

## 2024-08-26 NOTE — Telephone Encounter (Signed)
-----   Message from Tinnie MARLA Pro sent at 08/24/2024  3:27 PM EDT ----- Please let her know that her pap was normal and HPV negative. Her swab did still show candida glabrata, however based on current guidelines if she is not symptomatic then this does not need to be  treated again as it can be a common finding for some people. ----- Message ----- From: Interface, [facility] Lab Results In Sent: 07/23/2024   1:35 PM EDT To: Tinnie MARLA Pro, PA-C

## 2024-08-26 NOTE — Telephone Encounter (Signed)
 Spoke with patient regarding pap results.

## 2024-09-20 ENCOUNTER — Other Ambulatory Visit: Payer: Self-pay | Admitting: Physician Assistant

## 2024-09-20 DIAGNOSIS — I10 Essential (primary) hypertension: Secondary | ICD-10-CM

## 2024-09-26 ENCOUNTER — Ambulatory Visit (INDEPENDENT_AMBULATORY_CARE_PROVIDER_SITE_OTHER): Payer: Federal, State, Local not specified - PPO

## 2024-09-26 ENCOUNTER — Encounter (INDEPENDENT_AMBULATORY_CARE_PROVIDER_SITE_OTHER): Payer: Self-pay | Admitting: Vascular Surgery

## 2024-09-26 ENCOUNTER — Ambulatory Visit (INDEPENDENT_AMBULATORY_CARE_PROVIDER_SITE_OTHER): Payer: Federal, State, Local not specified - PPO | Admitting: Vascular Surgery

## 2024-09-26 VITALS — BP 133/77 | HR 53 | Resp 18 | Ht 69.0 in | Wt 196.0 lb

## 2024-09-26 DIAGNOSIS — I70213 Atherosclerosis of native arteries of extremities with intermittent claudication, bilateral legs: Secondary | ICD-10-CM

## 2024-09-26 DIAGNOSIS — J4489 Other specified chronic obstructive pulmonary disease: Secondary | ICD-10-CM | POA: Diagnosis not present

## 2024-09-26 DIAGNOSIS — I10 Essential (primary) hypertension: Secondary | ICD-10-CM

## 2024-09-26 DIAGNOSIS — E782 Mixed hyperlipidemia: Secondary | ICD-10-CM

## 2024-09-26 NOTE — Progress Notes (Signed)
 MRN : [id number]  Breanna Flores is a 63 y.o. (10-08-1961) female who presents with chief complaint of check circulation.  History of Present Illness:   The patient returns to the office for followup and review of the noninvasive studies.    There have been no interval changes in lower extremity symptoms. No interval shortening of the patient's claudication distance or development of rest pain symptoms. No new ulcers or wounds have occurred since the last visit.   There have been no significant changes to the patient's overall health care.   The patient denies amaurosis fugax or recent TIA symptoms. There are no documented recent neurological changes noted. There is no history of DVT, PE or superficial thrombophlebitis. The patient denies recent episodes of angina or shortness of breath.    ABI's today show Rt=0.82 and Lt=0.93  (previous ABI's Rt=0.78 and Lt=0.85 )  Current Meds  Medication Sig   amLODipine  (NORVASC ) 10 MG tablet TAKE 1 TABLET BY MOUTH ONCE DAILY   amLODipine  (NORVASC ) 5 MG tablet TAKE 1 TABLET BY MOUTH ONCE DAILY   aspirin EC 81 MG tablet Take 81 mg by mouth daily. Swallow whole.   ezetimibe  (ZETIA ) 10 MG tablet TAKE 1 TABLET BY MOUTH ONCE DAILY   furosemide  (LASIX ) 20 MG tablet TAKE 1 TABLET BY MOUTH ONCE DAILY AS NEEDED   hydrALAZINE  (APRESOLINE ) 10 MG tablet TAKE 1 TABLET BY MOUTH ONCE DAILY   losartan -hydrochlorothiazide (HYZAAR) 100-25 MG tablet TAKE 1 TABLET BY MOUTH ONCE DAILY   metoprolol  succinate (TOPROL -XL) 100 MG 24 hr tablet TAKE 1 TABLET BY MOUTH ONCE DAILY   Multiple Vitamin (MULTIVITAMIN) tablet Take 1 tablet by mouth daily.   NON FORMULARY cpap device   potassium chloride  (KLOR-CON ) 8 MEQ tablet TAKE 1 TABLET BY MOUTH 2 DAYS PER WEEK AS DIRECTED   simvastatin  (ZOCOR ) 20 MG tablet TAKE 1 TABLET BY MOUTH ONCE DAILY *NEED APPOINTMENT FOR NEXT REFILLS   varenicline  (CHANTIX ) 0.5 MG  tablet Take 1 tablet (0.5 mg total) by mouth 2 (two) times daily.    Past Medical History:  Diagnosis Date   Hyperlipidemia    Hypertension    Sleep apnea     Past Surgical History:  Procedure Laterality Date   CATARACT EXTRACTION Left 03/2024   COLONOSCOPY WITH PROPOFOL  N/A 01/30/2022   Procedure: COLONOSCOPY WITH PROPOFOL ;  Surgeon: Jinny Carmine, MD;  Location: [facility] ENDOSCOPY;  Service: Endoscopy;  Laterality: N/A;   EYE SURGERY  1963, 1978    Social History Social History   Tobacco Use   Smoking status: Every Day    Current packs/day: 0.00    Average packs/day: 1 pack/day for 37.9 years (37.9 ttl pk-yrs)    Types: Cigarettes    Start date: 11/29/1976    Last attempt to quit: 2016    Years since quitting: 9.8   Smokeless tobacco: Never   Tobacco comments:    A couple of cigarettes a day, has restarted smoking.try to stopped its on Chantix   Substance Use Topics   Alcohol use: Not Currently    Comment: rarely   Drug use: Never  Family History Family History  Problem Relation Age of Onset   Dementia Mother    Heart murmur Mother    Hypertension Father    Gout Father    Hyperlipidemia Father     Allergies  Allergen Reactions   Erythromycin Rash   Penicillins Rash   Tape Rash    Specifically ACE wrap      REVIEW OF SYSTEMS (Negative unless checked)  Constitutional: [] Weight loss  [] Fever  [] Chills Cardiac: [] Chest pain   [] Chest pressure   [] Palpitations   [] Shortness of breath when laying flat   [] Shortness of breath with exertion. Vascular:  [x] Pain in legs with walking   [] Pain in legs at rest  [] History of DVT   [] Phlebitis   [] Swelling in legs   [] Varicose veins   [] Non-healing ulcers Pulmonary:   [] Uses home oxygen   [] Productive cough   [] Hemoptysis   [] Wheeze  [] COPD   [] Asthma Neurologic:  [] Dizziness   [] Seizures   [] History of stroke   [] History of TIA  [] Aphasia   [] Vissual changes   [] Weakness or numbness in arm   [] Weakness or numbness in  leg Musculoskeletal:   [] Joint swelling   [] Joint pain   [] Low back pain Hematologic:  [] Easy bruising  [] Easy bleeding   [] Hypercoagulable state   [] Anemic Gastrointestinal:  [] Diarrhea   [] Vomiting  [] Gastroesophageal reflux/heartburn   [] Difficulty swallowing. Genitourinary:  [] Chronic kidney disease   [] Difficult urination  [] Frequent urination   [] Blood in urine Skin:  [] Rashes   [] Ulcers  Psychological:  [] History of anxiety   []  History of major depression.  Physical Examination  There were no vitals filed for this visit. There is no height or weight on file to calculate BMI. Gen: WD/WN, NAD Head: [place]/AT, No temporalis wasting.  Ear/Nose/Throat: Hearing grossly intact, nares w/o erythema or drainage Eyes: PER, EOMI, sclera nonicteric.  Neck: Supple, no masses.  No bruit or JVD.  Pulmonary:  Good air movement, no audible wheezing, no use of accessory muscles.  Cardiac: RRR, normal S1, S2, no Murmurs. Vascular:  mild trophic changes, no open wounds Vessel Right Left  Radial Palpable Palpable  PT Not Palpable Not Palpable  DP Not Palpable Not Palpable  Gastrointestinal: soft, non-distended. No guarding/no peritoneal signs.  Musculoskeletal: M/S 5/5 throughout.  No visible deformity.  Neurologic: CN 2-12 intact. Pain and light touch intact in extremities.  Symmetrical.  Speech is fluent. Motor exam as listed above. Psychiatric: Judgment intact, Mood & affect appropriate for pt's clinical situation. Dermatologic: No rashes or ulcers noted.  No changes consistent with cellulitis.   CBC Lab Results  Component Value Date   WBC 8.0 08/20/2023   HGB 15.7 (H) 08/20/2023   HCT 47.7 (H) 08/20/2023   MCV 90.5 08/20/2023   PLT 181 08/20/2023    BMET    Component Value Date/Time   NA 136 08/20/2023 0843   K 3.6 08/20/2023 0843   CL 100 08/20/2023 0843   CO2 26 08/20/2023 0843   GLUCOSE 123 (H) 08/20/2023 0843   BUN 10 08/20/2023 0843   CREATININE 0.71 08/20/2023 0843    CALCIUM 9.3 08/20/2023 0843   GFRNONAA >60 08/20/2023 0843   GFRAA >60 04/19/2020 1018   CrCl cannot be calculated (Patient's most recent lab result is older than the maximum 21 days allowed.).  COAG No results found for: INR, PROTIME  Radiology No results found.   Assessment/Plan 1. Atherosclerosis of native artery of both lower extremities with intermittent claudication (Primary) Recommend:  The patient has evidence of atherosclerosis of the lower extremities with claudication.  The patient does not voice lifestyle limiting changes at this point in time.   Noninvasive studies do not suggest clinically significant change.   No invasive studies, angiography or surgery at this time The patient should continue walking and begin a more formal exercise program.  The patient should continue antiplatelet therapy and aggressive treatment of the lipid abnormalities   No changes in the patient's medications at this time   Continued surveillance is indicated as atherosclerosis is likely to progress with time.     The patient will continue follow up with noninvasive studies as ordered.  - VAS US  ABI WITH/WO TBI; Future  2. Essential hypertension Continue antihypertensive medications as already ordered, these medications have been reviewed and there are no changes at this time.  3. Obstructive chronic bronchitis without exacerbation (HCC) Continue pulmonary medications and aerosols as already ordered, these medications have been reviewed and there are no changes at this time.   4. Mixed hyperlipidemia Continue statin as ordered and reviewed, no changes at this time    Cordella Shawl, MD  09/26/2024 10:28 AM

## 2024-09-28 LAB — VAS US ABI WITH/WO TBI
Left ABI: 0.93
Right ABI: 0.82

## 2024-10-03 ENCOUNTER — Other Ambulatory Visit: Payer: Self-pay | Admitting: Internal Medicine

## 2024-10-03 DIAGNOSIS — Z87891 Personal history of nicotine dependence: Secondary | ICD-10-CM

## 2024-10-15 ENCOUNTER — Encounter (INDEPENDENT_AMBULATORY_CARE_PROVIDER_SITE_OTHER): Payer: Self-pay | Admitting: Vascular Surgery

## 2024-10-31 ENCOUNTER — Other Ambulatory Visit: Payer: Self-pay | Admitting: Internal Medicine

## 2024-10-31 DIAGNOSIS — Z87891 Personal history of nicotine dependence: Secondary | ICD-10-CM

## 2024-11-02 ENCOUNTER — Other Ambulatory Visit: Payer: Self-pay | Admitting: Internal Medicine

## 2024-11-02 DIAGNOSIS — Z87891 Personal history of nicotine dependence: Secondary | ICD-10-CM

## 2024-11-09 ENCOUNTER — Other Ambulatory Visit: Payer: Self-pay | Admitting: Internal Medicine

## 2024-11-09 DIAGNOSIS — Z87891 Personal history of nicotine dependence: Secondary | ICD-10-CM

## 2024-11-11 NOTE — Telephone Encounter (Signed)
"  Pt needs an appt  "

## 2024-11-29 ENCOUNTER — Other Ambulatory Visit: Payer: Self-pay | Admitting: Physician Assistant

## 2024-11-29 DIAGNOSIS — I10 Essential (primary) hypertension: Secondary | ICD-10-CM

## 2024-12-08 ENCOUNTER — Other Ambulatory Visit: Payer: Self-pay | Admitting: Physician Assistant

## 2024-12-08 DIAGNOSIS — I10 Essential (primary) hypertension: Secondary | ICD-10-CM

## 2024-12-12 ENCOUNTER — Ambulatory Visit: Admitting: Cardiovascular Disease

## 2025-01-25 ENCOUNTER — Ambulatory Visit: Admitting: Nurse Practitioner

## 2025-02-20 ENCOUNTER — Ambulatory Visit: Admitting: Physician Assistant

## 2025-06-19 ENCOUNTER — Ambulatory Visit: Admitting: Internal Medicine

## 2025-09-25 ENCOUNTER — Ambulatory Visit (INDEPENDENT_AMBULATORY_CARE_PROVIDER_SITE_OTHER): Admitting: Vascular Surgery

## 2025-09-25 ENCOUNTER — Encounter (INDEPENDENT_AMBULATORY_CARE_PROVIDER_SITE_OTHER)
